# Patient Record
Sex: Female | Born: 1980 | Race: White | Hispanic: No | Marital: Married | Smoking: Never smoker
Health system: Southern US, Community
[De-identification: ages and names within clinical notes are randomized; demographics above are authoritative.]

## PROBLEM LIST (undated history)

## (undated) DIAGNOSIS — I890 Lymphedema, not elsewhere classified: Secondary | ICD-10-CM

## (undated) DIAGNOSIS — J45909 Unspecified asthma, uncomplicated: Secondary | ICD-10-CM

## (undated) DIAGNOSIS — Z9109 Other allergy status, other than to drugs and biological substances: Secondary | ICD-10-CM

## (undated) DIAGNOSIS — G4730 Sleep apnea, unspecified: Secondary | ICD-10-CM

## (undated) DIAGNOSIS — F419 Anxiety disorder, unspecified: Secondary | ICD-10-CM

## (undated) DIAGNOSIS — I10 Essential (primary) hypertension: Secondary | ICD-10-CM

## (undated) DIAGNOSIS — E282 Polycystic ovarian syndrome: Secondary | ICD-10-CM

## (undated) DIAGNOSIS — R7303 Prediabetes: Secondary | ICD-10-CM

## (undated) DIAGNOSIS — E7800 Pure hypercholesterolemia, unspecified: Secondary | ICD-10-CM

## (undated) HISTORY — PX: TYMPANOPLASTY: SHX33

## (undated) HISTORY — DX: Anxiety disorder, unspecified: F41.9

## (undated) HISTORY — DX: Other allergy status, other than to drugs and biological substances: Z91.09

## (undated) HISTORY — DX: Pure hypercholesterolemia, unspecified: E78.00

## (undated) HISTORY — DX: Lymphedema, not elsewhere classified: I89.0

## (undated) HISTORY — DX: Unspecified asthma, uncomplicated: J45.909

---
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
## 2005-09-18 ENCOUNTER — Other Ambulatory Visit: Admission: RE | Admit: 2005-09-18 | Discharge: 2005-09-18 | Payer: Self-pay | Admitting: Obstetrics & Gynecology

## 2006-10-11 ENCOUNTER — Other Ambulatory Visit: Admission: RE | Admit: 2006-10-11 | Discharge: 2006-10-11 | Payer: Self-pay | Admitting: Obstetrics and Gynecology

## 2006-11-15 ENCOUNTER — Ambulatory Visit (HOSPITAL_COMMUNITY): Admission: RE | Admit: 2006-11-15 | Discharge: 2006-11-15 | Payer: Self-pay | Admitting: Obstetrics & Gynecology

## 2006-11-15 IMAGING — RF DG HYSTEROGRAM
5 series · 5 of 5 positions shown · non-contrast
Comparison: none

CLINICAL DATA: Primary infertility.
 HYSTEROSALPINGOGRAM:

[Series 1: run · 1 of 1 slices shown (1 of 5)]
[im 1/1]
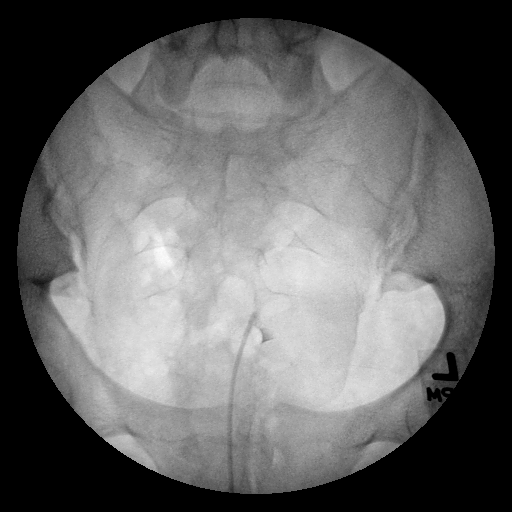

[Series 2: run · 1 of 1 slices shown (2 of 5)]
[im 1/1]
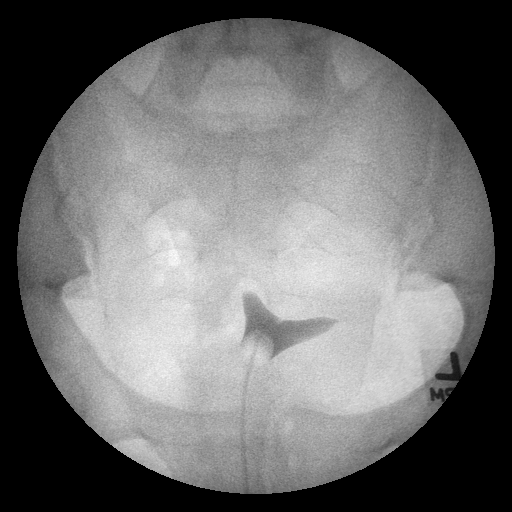

[Series 3: run · 1 of 1 slices shown (3 of 5)]
[im 1/1]
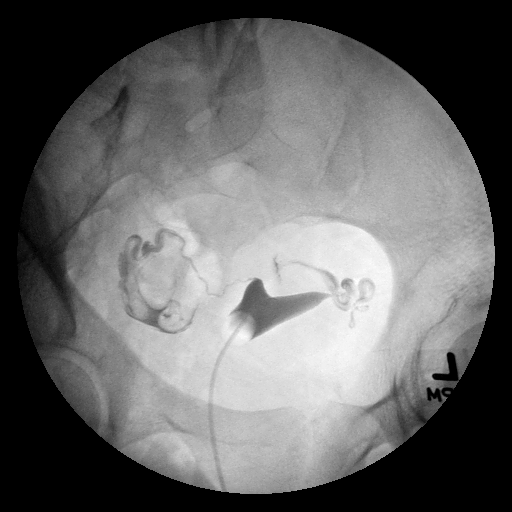

[Series 4: run · 1 of 1 slices shown (4 of 5)]
[im 1/1]
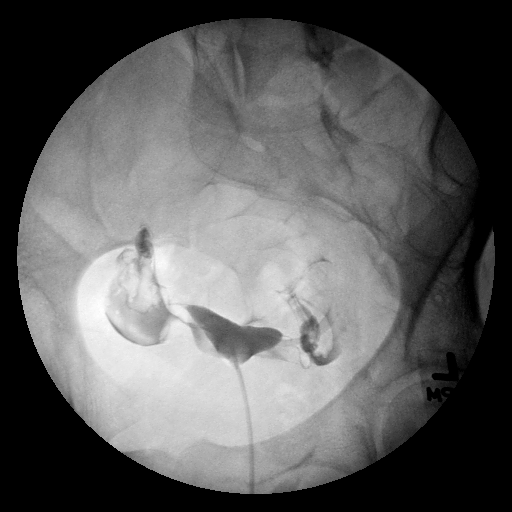

[Series 5: run · 1 of 1 slices shown (5 of 5)]
[im 1/1]
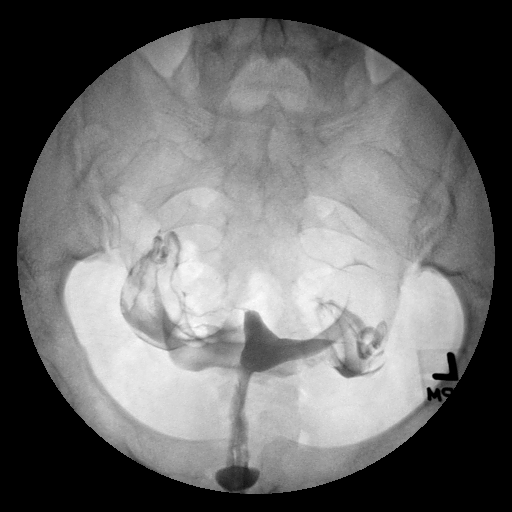

[5 of 5 positions shown; findings below may reference images not displayed]

FINDINGS: Following cleansing of the cervix and vagina with Betadine solution, a hysterosalpingogram was performed using a 5-French hysterosalpingogram catheter and Omnipaque 300 contrast.  The patient tolerated the examination without difficulty.
 The endometrial cavity of the uterus is normal in contour and appearance.
 Opacification of both fallopian tubes is seen.  Intraperitoneal spill of contrast is seen from both fallopian tubes.
IMPRESSION: Both fallopian tubes are patent.  Normal appearance of the endometrial cavity.

## 2008-02-26 ENCOUNTER — Inpatient Hospital Stay (HOSPITAL_COMMUNITY): Admission: AD | Admit: 2008-02-26 | Discharge: 2008-02-26 | Payer: Self-pay | Admitting: Obstetrics and Gynecology

## 2008-03-01 ENCOUNTER — Inpatient Hospital Stay (HOSPITAL_COMMUNITY): Admission: AD | Admit: 2008-03-01 | Discharge: 2008-03-01 | Payer: Self-pay | Admitting: Obstetrics and Gynecology

## 2008-03-02 ENCOUNTER — Inpatient Hospital Stay (HOSPITAL_COMMUNITY): Admission: RE | Admit: 2008-03-02 | Discharge: 2008-03-05 | Payer: Self-pay | Admitting: Obstetrics and Gynecology

## 2008-03-06 ENCOUNTER — Encounter: Admission: RE | Admit: 2008-03-06 | Discharge: 2008-03-15 | Payer: Self-pay | Admitting: Obstetrics and Gynecology

## 2009-01-15 DIAGNOSIS — D802 Selective deficiency of immunoglobulin A [IgA]: Secondary | ICD-10-CM

## 2009-01-15 HISTORY — DX: Selective deficiency of immunoglobulin a (iga): D80.2

## 2010-02-05 ENCOUNTER — Encounter: Payer: Self-pay | Admitting: Obstetrics & Gynecology

## 2010-05-02 LAB — COMPREHENSIVE METABOLIC PANEL
ALT: 18 U/L (ref 0–35)
Albumin: 2.6 g/dL — ABNORMAL LOW (ref 3.5–5.2)
Albumin: 2 g/dL — ABNORMAL LOW (ref 3.5–5.2)
BUN: 8 mg/dL (ref 6–23)
CO2: 23 mEq/L (ref 19–32)
Calcium: 9.2 mg/dL (ref 8.4–10.5)
Calcium: 8.3 mg/dL — ABNORMAL LOW (ref 8.4–10.5)
Chloride: 103 mEq/L (ref 96–112)
Chloride: 102 mEq/L (ref 96–112)
Creatinine, Ser: 0.84 mg/dL (ref 0.4–1.2)
GFR calc Af Amer: >60 mL/min (ref >60)
GFR calc Af Amer: >60 mL/min (ref >60)
GFR calc non Af Amer: >60 mL/min (ref >60)
GFR calc non Af Amer: >60 mL/min (ref >60)
Glucose, Bld: 72 mg/dL (ref 70–99)
Glucose, Bld: 85 mg/dL (ref 70–99)
Potassium: 4.4 mEq/L (ref 3.5–5.1)
Sodium: 134 mEq/L — ABNORMAL LOW (ref 135–145)
Sodium: 135 mEq/L (ref 135–145)
Sodium: 135 mEq/L (ref 135–145)
Total Bilirubin: 0.4 mg/dL (ref 0.3–1.2)
Total Bilirubin: 0.5 mg/dL (ref 0.3–1.2)
Total Protein: 6.2 g/dL (ref 6.0–8.3)

## 2010-05-02 LAB — RPR: RPR Ser Ql: NONREACTIVE

## 2010-05-02 LAB — CBC
HCT: 39.2 % (ref 36.0–46.0)
HCT: 30.3 % — ABNORMAL LOW (ref 36.0–46.0)
HCT: 30.9 % — ABNORMAL LOW (ref 36.0–46.0)
HCT: 37.9 % (ref 36.0–46.0)
Hemoglobin: 12.6 g/dL (ref 12.0–15.0)
Hemoglobin: 10.1 g/dL — ABNORMAL LOW (ref 12.0–15.0)
Hemoglobin: 12.4 g/dL (ref 12.0–15.0)
MCHC: 32.7 g/dL (ref 30.0–36.0)
MCHC: 32.8 g/dL (ref 30.0–36.0)
MCHC: 32.7 g/dL (ref 30.0–36.0)
MCHC: 33.1 g/dL (ref 30.0–36.0)
MCV: 89.4 fL (ref 78.0–100.0)
MCV: 89 fL (ref 78.0–100.0)
MCV: 89.6 fL (ref 78.0–100.0)
Platelets: 271 10*3/uL (ref 150–400)
Platelets: 205 10*3/uL (ref 150–400)
Platelets: 249 10*3/uL (ref 150–400)
RBC: 4.39 MIL/uL (ref 3.87–5.11)
RBC: 4.26 MIL/uL (ref 3.87–5.11)
RDW: 14.7 % (ref 11.5–15.5)
WBC: 12.7 10*3/uL — ABNORMAL HIGH (ref 4.0–10.5)
WBC: 13.8 10*3/uL — ABNORMAL HIGH (ref 4.0–10.5)
WBC: 16.5 10*3/uL — ABNORMAL HIGH (ref 4.0–10.5)

## 2010-05-02 LAB — LACTATE DEHYDROGENASE: LDH: 143 U/L (ref 94–250)

## 2010-05-02 LAB — URIC ACID
Uric Acid, Serum: 5.7 mg/dL (ref 2.4–7.0)
Uric Acid, Serum: 6.2 mg/dL (ref 2.4–7.0)
Uric Acid, Serum: 6.9 mg/dL (ref 2.4–7.0)

## 2010-05-30 NOTE — Op Note (Signed)
NAME:  Lori Pitts, Lori Pitts NO.:  [id number]   MEDICAL RECORD NO.:  [id number]          PATIENT TYPE:  INP   LOCATION:  9106                          FACILITY:  [facility]   PHYSICIAN:  Juluis Mire, M.D.   DATE OF BIRTH:  07/19/1980   DATE OF PROCEDURE:  DATE OF DISCHARGE:                               OPERATIVE REPORT   PREOPERATIVE DIAGNOSES:  1. Pregnancy at 39 weeks.  2. Pregnancy-induced hypertension.  3. Induction of labor.  4. Failure to progress.   POSTOPERATIVE DIAGNOSES:  1. Pregnancy at 39 weeks.  2. Pregnancy-induced hypertension.  3. Induction of labor.  4. Failure to progress.   PROCEDURE:  Low-transverse cesarean section.   SURGEON:  Juluis Mire, MD   ANESTHESIA:  Epidural.   ESTIMATED BLOOD LOSS:  500-600 mL.   PACKS AND DRAINS:  None.   INTRAOPERATIVE BLOOD PLACED:  None.   COMPLICATIONS:  None.   INDICATIONS:  A 30 year old primigravida female presents at 39 weeks for  induction of labor, pregnancy complicated by elevated blood pressure.  The patient progressed with the aid of Pitocin to 6 cm, was arrested  there for approximately 4 hours despite adequate uterine activity as  documented by the intrauterine pressure catheter, and decision to  proceed with cesarean section for failure to progress.  The risks were  explained including the risk of infection, the risk of hemorrhage that  could require transfusion, the risk of AIDS or hepatitis.  Excessive  bleeding could require hysterectomy.  Risk of injury to adjacent organs  including bladder, bowel, or ureters that could require further  exploratory surgery.  Risk of deep venous thrombosis and pulmonary  embolus.  The patient expressed understanding of indications and risks.   PROCEDURES IN DETAIL:  The patient was taken to OR, placed supine in  position with left lateral tilt.  After a satisfactory level of epidural  anesthesia was obtained, the abdomen was prepped out with Betadine  and  draped in sterile field.  A low-transverse skin incision made with knife  and carried through the subcutaneous tissue.  Fascia was entered sharp  and incision fashioned laterally.  Fascia taken off the muscle  superiorly and inferiorly.  Rectus muscles were separated in the  midline.  The perineum was entered sharp and the incision of perineum  extended both superiorly and inferiorly.  Low-transverse bladder flap  developed.  Low-transverse uterine incision made with knife.  The infant  was delivered with elevation in head and fundal pressure.  It is of note  that the amniotic fluid was clear at this point in time, although  initial on AROM, there was a question of meconium.  There was a nuchal  and shoulder cord that was easily reduced.  The infant was a viable  female.  Weight is pending.  The Apgars were 5/7.  Umbilical cord pH was  7.25.  Placenta was delivered manually.  Uterus was then exteriorized  for closure.  Uterus, tubes, and ovaries were unremarkable.  Uterus  closed with running-locking suture of 0 chromic using a two-layered  closure  technique.  Good hemostasis was noted.  Uterus was returned to  the abdominal cavity.  We irrigated the pelvis, had good hemostasis.  Urine output was bloody as she presented to the OR.  It was clearing in  the tube at the end of cesarean section.  The evaluation of bladder  revealed no lacerations.  Muscles and peritoneum closed with a running  suture of 3-0 Vicryl.  Fascia closed with a running suture of 0 PDS.  Skin was closed with staples and Steri-Strips.  Sponge, instrument, and  needle count was correct by circulating nurse x2.  Foley catheter was  clear at the time of closure.  The patient tolerated the procedure well  and was returned to recovery room in good condition.      Juluis Mire, M.D.  Electronically Signed     JSM/MEDQ  D:  03/02/2008  T:  03/03/2008  Job:  [id number]

## 2010-05-30 NOTE — Discharge Summary (Signed)
NAMEAVAH, BASHOR                 ACCOUNT NO.:  [id number]   MEDICAL RECORD NO.:  [id number]          PATIENT TYPE:  INP   LOCATION:  9106                          FACILITY:  [facility]   PHYSICIAN:  Dineen Kid. Rana Snare, M.D.    DATE OF BIRTH:  10-17-1980   DATE OF ADMISSION:  03/02/2008  DATE OF DISCHARGE:  03/05/2008                               DISCHARGE SUMMARY   ADMITTING DIAGNOSES:  1. Intrauterine pregnancy at 25 weeks' estimated gestational age.  2. Pregnancy-induced hypertension.  3. Induction of labor.   DISCHARGE DIAGNOSES:  1. Status post low transverse cesarean section secondary to failure to      progress.  2. Viable female infant.   PROCEDURE:  Low transverse cesarean section.   REASON FOR ADMISSION:  Please see written H and P.   HOSPITAL COURSE:  The patient is a 30 year old primigravida that was  ad[facility] to [facility] at 24 weeks' estimated  gestational age for an induction of labor secondary to pregnancy-induced  hypertension.  The patient was started on Pitocin for augmentation of  her labor.  She did progress to 6 cm, at which time she did arrest and  made no further progress.  After 4 hours, it persistent 6 cm without  further change in the cervix.  decision was made to proceed with a  primary low transverse cesarean section.  The patient had an epidural  placed for her comfort, it now was dosed to an adequate surgical level.  The patient was taken to the operating room where low transverse  incision was made with delivery of a viable female infant weighing 7  pounds 0 ounces with Apgars of 5 at 1 minute and 7 and 5 minutes.  The  patient tolerated the procedure well and was taken to the recovery room  in stable condition.  On postoperative day 1, the patient was without  complaint.  Vital signs were stable.  She was afebrile.  Blood pressure  was 112/71 to 137/92.  Abdomen was soft.  Lungs were clear to  auscultation.  Fundus was firm and  nontender.  Abdominal dressing was  noted be clean, dry, and intact.  Laboratory findings showed hemoglobin  of 10.2, platelet count of 205,000, WBC count of 16.5.  Blood type was  noted to be A+.  On postoperative day 2, the patient denied headache,  blurred vision, or right upper quadrant pain.  She had not been given  anything for pain secondary to documented allergy to TYLENOL.  The  patient had previously taken Extra Strength Tylenol during her pregnancy  and therefore was not considered to be allergic.  Vital signs were  stable.  Blood pressure 125/83 to 123/87.  Deep tendon reflexes were 3+  with 1 beat of clonus bilaterally.  Abdomen was soft, no right upper  quadrant tenderness noted.  Fundus was firm and nontender.  Incision was  clean, dry, and intact.  She was ambulating.  PIH labs were drawn, and  the patient was started on some Percocet.  Later that morning, PIH labs  returned which were  within normal limits with exception of uric acid of  6.9.  On postoperative day 3, the patient was without complaint.  Continued to deny headache or blurred vision.  Vital signs were stable.  She was afebrile.  Abdomen was soft.  Fundus firm and nontender.  Incision was clean, dry, and intact.  Discharge instructions were  reviewed, and the patient was later discharged home.   CONDITION ON DISCHARGE:  Stable.   DIET:  Regular as tolerated.   ACTIVITY:  No heavy lifting, no driving x2 weeks, no vaginal entry.   FOLLOWUP:  The patient is to follow up in the office in 1-2 weeks for an  incision check.  She is to call for temperature greater than 100  degrees, persistent nausea, vomiting, heavy vaginal bleeding, and/or  redness or drainage from the incisional site.  The patient was also  instructed to call for headache, blurred vision, or right upper quadrant  pain.   DISCHARGE MEDICATIONS:  1. Tylox #30 one p.o. every 4-6 hours p.r.n.  2. Motrin 600 mg every 6 hours.  3. Continue the  Wellbutrin and Prozac as previously prescribed.  4. Prenatal vitamins 1 p.o. daily.      Julio Sicks, N.P.      Dineen Kid Rana Snare, M.D.  Electronically Signed    CC/MEDQ  D:  03/05/2008  T:  03/05/2008  Job:  [id number]

## 2012-05-26 DIAGNOSIS — D802 Selective deficiency of immunoglobulin A [IgA]: Secondary | ICD-10-CM | POA: Insufficient documentation

## 2012-06-21 DIAGNOSIS — K589 Irritable bowel syndrome without diarrhea: Secondary | ICD-10-CM | POA: Insufficient documentation

## 2015-04-17 ENCOUNTER — Emergency Department (HOSPITAL_COMMUNITY)
Admission: EM | Admit: 2015-04-17 | Discharge: 2015-04-17 | Disposition: A | Discharge status: Home / Self Care | Payer: Managed Care, Other (non HMO) | Attending: Emergency Medicine | Admitting: Emergency Medicine

## 2015-04-17 ENCOUNTER — Encounter (HOSPITAL_COMMUNITY): Payer: Self-pay

## 2015-04-17 DIAGNOSIS — R0602 Shortness of breath: Secondary | ICD-10-CM | POA: Insufficient documentation

## 2015-04-17 DIAGNOSIS — R55 Syncope and collapse: Secondary | ICD-10-CM | POA: Diagnosis present

## 2015-04-17 DIAGNOSIS — Z7952 Long term (current) use of systemic steroids: Secondary | ICD-10-CM | POA: Diagnosis not present

## 2015-04-17 DIAGNOSIS — Z79899 Other long term (current) drug therapy: Secondary | ICD-10-CM | POA: Diagnosis not present

## 2015-04-17 DIAGNOSIS — R42 Dizziness and giddiness: Secondary | ICD-10-CM | POA: Diagnosis not present

## 2015-04-17 DIAGNOSIS — R531 Weakness: Secondary | ICD-10-CM | POA: Insufficient documentation

## 2015-04-17 DIAGNOSIS — Z3202 Encounter for pregnancy test, result negative: Secondary | ICD-10-CM | POA: Insufficient documentation

## 2015-04-17 LAB — COMPREHENSIVE METABOLIC PANEL
ALK PHOS: 66 U/L (ref 38–126)
ALT: 18 U/L (ref 14–54)
AST: 21 U/L (ref 15–41)
Albumin: 3.4 g/dL — ABNORMAL LOW (ref 3.5–5.0)
Anion gap: 9 (ref 5–15)
BUN: 8 mg/dL (ref 6–20)
CHLORIDE: 107 mmol/L (ref 101–111)
CO2: 24 mmol/L (ref 22–32)
Calcium: 8.8 mg/dL — ABNORMAL LOW (ref 8.9–10.3)
Creatinine, Ser: 0.64 mg/dL (ref 0.44–1.00)
GFR calc non Af Amer: >60 mL/min (ref >60)
Glucose, Bld: 108 mg/dL — ABNORMAL HIGH (ref 65–99)
POTASSIUM: 3.7 mmol/L (ref 3.5–5.1)
SODIUM: 140 mmol/L (ref 135–145)
Total Bilirubin: 0.5 mg/dL (ref 0.3–1.2)
Total Protein: 6.7 g/dL (ref 6.5–8.1)

## 2015-04-17 LAB — CBC WITH DIFFERENTIAL/PLATELET
Basophils Absolute: 0 10*3/uL (ref 0.0–0.1)
Basophils Relative: 0 %
EOS ABS: 0.2 10*3/uL (ref 0.0–0.7)
EOS PCT: 2 %
HCT: 41.3 % (ref 36.0–46.0)
Hemoglobin: 13.4 g/dL (ref 12.0–15.0)
LYMPHS ABS: 2.3 10*3/uL (ref 0.7–4.0)
Lymphocytes Relative: 23 %
MCH: 29.2 pg (ref 26.0–34.0)
MCHC: 32.4 g/dL (ref 30.0–36.0)
MCV: 90 fL (ref 78.0–100.0)
MONO ABS: 0.5 10*3/uL (ref 0.1–1.0)
MONOS PCT: 5 %
Neutro Abs: 7 10*3/uL (ref 1.7–7.7)
Neutrophils Relative %: 70 %
PLATELETS: 361 10*3/uL (ref 150–400)
RBC: 4.59 MIL/uL (ref 3.87–5.11)
RDW: 12.6 % (ref 11.5–15.5)
WBC: 10.1 10*3/uL (ref 4.0–10.5)

## 2015-04-17 LAB — URINALYSIS, ROUTINE W REFLEX MICROSCOPIC
Bilirubin Urine: NEGATIVE
Glucose, UA: NEGATIVE mg/dL
Hgb urine dipstick: NEGATIVE
Ketones, ur: NEGATIVE mg/dL
Leukocytes, UA: NEGATIVE
NITRITE: NEGATIVE
PH: 8 (ref 5.0–8.0)
Protein, ur: NEGATIVE mg/dL
SPECIFIC GRAVITY, URINE: 1.012 (ref 1.005–1.030)

## 2015-04-17 LAB — CBG MONITORING, ED: Glucose-Capillary: 94 mg/dL (ref 65–99)

## 2015-04-17 LAB — PREGNANCY, URINE: Preg Test, Ur: NEGATIVE

## 2015-04-17 MED ORDER — LORAZEPAM 1 MG PO TABS: 0.5000 mg | ORAL_TABLET | Freq: Three times a day (TID) | ORAL | Status: DC | PRN

## 2015-04-17 NOTE — ED Provider Notes (Signed)
CSN: [id number]     Arrival date & time 04/17/15  1934 History   First MD Initiated Contact with Patient 04/17/15 1959     Chief Complaint  Patient presents with  . Near Syncope     (Consider location/radiation/quality/duration/timing/severity/associated sxs/prior Treatment) HPI Comments: 35 y/o female with a hx of PCOS presents to the ED for evaluation of an episode of near syncope. Patient reports that she was at her son's baseball game when she began to feel very anxious. She reports that she was breathing fairly quickly and began to feel lightheaded. She was told to sit down by a friend which mildly improved her symptoms. She now complains of feeling "really tired"; her symptoms resolved spontaneously. No medications taken prior to arrival. She reports a history of similar symptoms one week ago. She states that she was standing in the shower when she began to feel lightheaded and sat down to try and relieve her lightheadedness. Husband reports that the patient lost consciousness for a period. He noticed the patient was "gurgling" at her mouth. Reported tongue biting, seizure activity/tremors, or incontinence. Symptoms lasted for 30-45 seconds with no postictal phase after regaining consciousness. Patient reports remembering that her hands were "cramped up" for a few minutes afterwards. She saw her primary care doctor this past Tuesday who completed blood work as well as Barista and an EKG. She has yet to obtain the results of her blood work. She states that her doctor was considering sending her for a "brain scan". She denies history of anxiety.  Patient is a 35 y.o. female presenting with near-syncope. The history is provided by the patient. No language interpreter was used.  Near Syncope Associated symptoms include weakness (generalized). Pertinent negatives include no fever, nausea or vomiting.    History reviewed. No pertinent past medical history. History reviewed. No pertinent past  surgical history. History reviewed. No pertinent family history. Social History  Substance Use Topics  . Smoking status: Never Smoker   . Smokeless tobacco: None  . Alcohol Use: No   OB History    No data available      Review of Systems  Constitutional: Negative for fever.  Respiratory: Positive for shortness of breath.   Cardiovascular: Positive for near-syncope.  Gastrointestinal: Negative for nausea and vomiting.  Neurological: Positive for weakness (generalized) and light-headedness.  Ten systems reviewed and are negative for acute change, except as noted in the HPI.    Allergies  Cherry; Benadryl; Bupropion; Duloxetine; Aspirin; Robitussin cold cough+ chest ; Tussin; and Tylenol  Home Medications   Prior to Admission medications   Medication Sig Start Date End Date Taking? Authorizing Provider  Armodafinil 250 MG tablet Take 250 mg by mouth daily. 03/03/15  Yes Historical Provider, MD  cetirizine (ZYRTEC) 10 MG tablet Take 10 mg by mouth at bedtime.   Yes Historical Provider, MD  Cholecalciferol 1000 UNT/0.03ML LIQD Take 6,000 Units by mouth daily.   Yes Historical Provider, MD  montelukast (SINGULAIR) 10 MG tablet Take 10 mg by mouth at bedtime.   Yes Historical Provider, MD  PROAIR RESPICLICK 108 [phone] Base) MCG/ACT AEPB Take 2 puffs by mouth daily as needed (20-30 minutes before physical exertion).  01/27/15  Yes Historical Provider, MD  triamcinolone (NASACORT) 55 MCG/ACT AERO nasal inhaler Place 2 sprays into the nose daily.   Yes Historical Provider, MD  LORazepam (ATIVAN) 1 MG tablet Take 0.5 tablets (0.5 mg total) by mouth 3 (three) times daily as needed for anxiety. 04/17/15  Antony MaduraKelly Nathanyl Andujo, PA-C   BP 119/63 mmHg  Pulse 70  Resp 20  SpO2 99%   Physical Exam  Constitutional: She is oriented to person, place, and time. She appears well-developed and well-nourished. No distress.  Nontoxic/nonseptic appearing  HENT:  Head: Normocephalic and atraumatic.  Mouth/Throat:  Oropharynx is clear and moist. No oropharyngeal exudate.  Eyes: Conjunctivae and EOM are normal. Pupils are equal, round, and reactive to light. No scleral icterus.  Neck: Normal range of motion.  Cardiovascular: Normal rate, regular rhythm and intact distal pulses.   Pulmonary/Chest: Effort normal and breath sounds normal. No respiratory distress. She has no wheezes. She has no rales.  Respirations even and unlabored. Lungs CTAB.  Musculoskeletal: Normal range of motion.  Neurological: She is alert and oriented to person, place, and time. No cranial nerve deficit. She exhibits normal muscle tone. Coordination normal.  GCS 15. Speech is goal oriented. No cranial nerve deficits appreciated; symmetric eyebrow raise, no facial drooping, tongue midline. Patient has equal grip strength bilaterally with 5/5 strength against resistance in all major muscle groups bilaterally. Sensation to light touch intact. Patient moves extremities without ataxia; normal finger-nose-finger. Patient ambulatory with steady gait.  Skin: Skin is warm and dry. No rash noted. She is not diaphoretic. No erythema. No pallor.  Psychiatric: She has a normal mood and affect. Her behavior is normal.  Nursing note and vitals reviewed.   ED Course  Procedures (including critical care time) Labs Review Labs Reviewed  COMPREHENSIVE METABOLIC PANEL - Abnormal; Notable for the following:    Glucose, Bld 108 (*)    Calcium 8.8 (*)    Albumin 3.4 (*)    All other components within normal limits  CBC WITH DIFFERENTIAL/PLATELET  URINALYSIS, ROUTINE W REFLEX MICROSCOPIC (NOT AT [facility])  PREGNANCY, URINE  CBG MONITORING, ED    Imaging Review No results found.   I have personally reviewed and evaluated these images and lab results as part of my medical decision-making.   EKG Interpretation   Date/Time:  Sunday April 17 2015 20:44:19 EDT Ventricular Rate:  80 PR Interval:  104 QRS Duration: 90 QT Interval:  402 QTC  Calculation: 464 R Axis:   61 Text Interpretation:  Sinus rhythm Short PR interval Baseline wander in  lead(s) V2 no signs of wpw, prolonged qt, brugada, HOCM, or arrythmogenic  RV No old tracing to compare Confirmed by FLOYD MD, DANIEL [phone]([id number]) on  04/17/2015 10:45:56 PM      MDM   Final diagnoses:  Near syncope    35 year old female presents to the emergency department after a near syncopal event characterized by increasing anxiety and hyperventilation followed by lightheadedness. Patient had a similar episode one week ago after which time she was evaluated by her primary care doctor. Her laboratory workup today is noncontributory. No reported seizure-like activity or postictal phase. No electrolyte abnormalities. EKG with a slightly shortened PR interval, but no ischemic changes.   Patient reports feeling tired, but denies any recurrence of her symptoms. I have discussed her workup and EKG findings with the patient at bedside who verbalizes understanding. I have recommended that she see her primary care doctor for further evaluation of her symptoms. I have also advised the patient to discuss having a Holter monitor placed by her PCP for further work up. No indication for further emergent testing at this time. Patient stable for discharge with no unaddressed concerns; discharged in good condition with stable vital signs.   Filed Vitals:   04/17/15 2000  04/17/15 2100 04/17/15 2130 04/17/15 2200  BP: 119/73 135/76 136/84 119/63  Pulse: 81 78 77 70  Resp: SpO2: 97% 97% 99% 99%     Antony Madura, PA-C 04/17/15 2338  Melene Plan, DO 04/19/15 0001

## 2015-04-17 NOTE — ED Notes (Signed)
Per EMS: Pt complaining of seizure like activity. No post-ictal phase. Family states pt "might have had a panic attack." Pt is being evaluated by a neurologist. A/o x 4. Pt complaining of being "real tired."

## 2015-04-17 NOTE — Discharge Instructions (Signed)
We recommend ativan to treat potential underlying anxiety. Take only as needed. We also recommend you discuss a Holter monitor with your primary care doctor to further monitor your heart rhythm around times when symptoms occur. Be sure to drink at least 8 - 8 ounce glasses or water per day. Try and get a regular 7-8 hours of sleep per night. Follow up with your primary care doctor.  Panic Attacks Panic attacks are sudden, short-livedsurges of severe anxiety, fear, or discomfort. They may occur for no reason when you are relaxed, when you are anxious, or when you are sleeping. Panic attacks may occur for a number of reasons:   Healthy people occasionally have panic attacks in extreme, life-threatening situations, such as war or natural disasters. Normal anxiety is a protective mechanism of the body that helps [place] react to danger (fight or flight response).  Panic attacks are often seen with anxiety disorders, such as panic disorder, social anxiety disorder, generalized anxiety disorder, and phobias. Anxiety disorders cause excessive or uncontrollable anxiety. They may interfere with your relationships or other life activities.  Panic attacks are sometimes seen with other mental illnesses, such as depression and posttraumatic stress disorder.  Certain medical conditions, prescription medicines, and drugs of abuse can cause panic attacks. SYMPTOMS  Panic attacks start suddenly, peak within 20 minutes, and are accompanied by four or more of the following symptoms:  Pounding heart or fast heart rate (palpitations).  Sweating.  Trembling or shaking.  Shortness of breath or feeling smothered.  Feeling choked.  Chest pain or discomfort.  Nausea or strange feeling in your stomach.  Dizziness, light-headedness, or feeling like you will faint.  Chills or hot flushes.  Numbness or tingling in your lips or hands and feet.  Feeling that things are not real or feeling that you are not  yourself.  Fear of losing control or going crazy.  Fear of dying. Some of these symptoms can mimic serious medical conditions. For example, you may think you are having a heart attack. Although panic attacks can be very scary, they are not life threatening. DIAGNOSIS  Panic attacks are diagnosed through an assessment by your health care provider. Your health care provider will ask questions about your symptoms, such as where and when they occurred. Your health care provider will also ask about your medical history and use of alcohol and drugs, including prescription medicines. Your health care provider may order blood tests or other studies to rule out a serious medical condition. Your health care provider may refer you to a mental health professional for further evaluation. TREATMENT   Most healthy people who have one or two panic attacks in an extreme, life-threatening situation will not require treatment.  The treatment for panic attacks associated with anxiety disorders or other mental illness typically involves counseling with a mental health professional, medicine, or a combination of both. Your health care provider will help determine what treatment is best for you.  Panic attacks due to physical illness usually go away with treatment of the illness. If prescription medicine is causing panic attacks, talk with your health care provider about stopping the medicine, decreasing the dose, or substituting another medicine.  Panic attacks due to alcohol or drug abuse go away with abstinence. Some adults need professional help in order to stop drinking or using drugs. HOME CARE INSTRUCTIONS   Take all medicines as directed by your health care provider.   Schedule and attend follow-up visits as directed by your health care provider.  It is important to keep all your appointments. SEEK MEDICAL CARE IF:  You are not able to take your medicines as prescribed.  Your symptoms do not improve or  get worse. SEEK IMMEDIATE MEDICAL CARE IF:   You experience panic attack symptoms that are different than your usual symptoms.  You have serious thoughts about hurting yourself or others.  You are taking medicine for panic attacks and have a serious side effect. MAKE SURE YOU:  Understand these instructions.  Will watch your condition.  Will get help right away if you are not doing well or get worse.   This information is not intended to replace advice given to you by your health care provider. Make sure you discuss any questions you have with your health care provider.   Document Released: 01/01/2005 Document Revised: 01/06/2013 Document Reviewed: 08/15/2012 Elsevier Interactive Patient Education 2016 [employer]   Near-Syncope Near-syncope (commonly known as near fainting) is sudden weakness, dizziness, or feeling like you might pass out. This can happen when getting up or while standing for a long time. It is caused by a sudden decrease in blood flow to the brain, which can occur for various reasons. Most of the reasons are not serious.  HOME CARE Watch your condition for any changes.  Have someone stay with you until you feel stable.  If you feel like you are going to pass out:  Lie down right away.  Prop your feet up if you can.  Breathe deeply and steadily.  Move only when the feeling has gone away. Most of the time, this feeling lasts only a few minutes. You may feel tired for several hours.  Drink enough fluids to keep your pee (urine) clear or pale yellow.  If you are taking blood pressure or heart medicine, stand up slowly.  Follow up with your doctor as told. GET HELP RIGHT AWAY IF:   You have a severe headache.  You have unusual pain in the chest, belly (abdomen), or back.  You have bleeding from the mouth or butt (rectum), or you have black or tarry poop (stool).  You feel your heart beat differently than normal, or you have a very fast  pulse.  You pass out, or you twitch and shake when you pass out.  You pass out when sitting or lying down.  You feel confused.  You have trouble walking.  You are weak.  You have vision problems. MAKE SURE YOU:   Understand these instructions.  Will watch your condition.  Will get help right away if you are not doing well or get worse.   This information is not intended to replace advice given to you by your health care provider. Make sure you discuss any questions you have with your health care provider.   Document Released: 06/20/2007 Document Revised: 01/22/2014 Document Reviewed: 06/06/2012 Elsevier Interactive Patient Education [employer]

## 2015-04-21 DIAGNOSIS — R768 Other specified abnormal immunological findings in serum: Secondary | ICD-10-CM | POA: Insufficient documentation

## 2015-05-30 ENCOUNTER — Ambulatory Visit (INDEPENDENT_AMBULATORY_CARE_PROVIDER_SITE_OTHER): Payer: Managed Care, Other (non HMO) | Admitting: Neurology

## 2015-05-30 ENCOUNTER — Encounter: Payer: Self-pay | Admitting: Neurology

## 2015-05-30 ENCOUNTER — Telehealth: Payer: Self-pay | Admitting: Neurology

## 2015-05-30 VITALS — BP 125/78 | HR 90 | Resp 18 | Ht 66.0 in | Wt 249.0 lb

## 2015-05-30 DIAGNOSIS — E282 Polycystic ovarian syndrome: Secondary | ICD-10-CM | POA: Diagnosis not present

## 2015-05-30 DIAGNOSIS — F411 Generalized anxiety disorder: Secondary | ICD-10-CM

## 2015-05-30 DIAGNOSIS — F32A Depression, unspecified: Secondary | ICD-10-CM

## 2015-05-30 DIAGNOSIS — R404 Transient alteration of awareness: Secondary | ICD-10-CM

## 2015-05-30 DIAGNOSIS — F429 Obsessive-compulsive disorder, unspecified: Secondary | ICD-10-CM | POA: Diagnosis not present

## 2015-05-30 DIAGNOSIS — D802 Selective deficiency of immunoglobulin A [IgA]: Secondary | ICD-10-CM | POA: Diagnosis not present

## 2015-05-30 DIAGNOSIS — F329 Major depressive disorder, single episode, unspecified: Secondary | ICD-10-CM
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
## 2015-05-30 NOTE — Patient Instructions (Signed)
Remember to drink plenty of fluid, eat healthy meals and do not skip any meals. Try to eat protein with a every meal and eat a healthy snack such as fruit or nuts in between meals. Try to keep a regular sleep-wake schedule and try to exercise daily, particularly in the form of walking, 20-30 minutes a day, if you can.   As far as your medications are concerned, I would like to suggest: continue current medications  As far as diagnostic testing: EEG then 3-day eeg  I would like to see you back after workup complete, sooner if we need to. Please call us with any interim questions, concerns, problems, updates or refill requests.   Our phone number is [phone]. We also have an after hours call service for urgent matters and there is a physician on-call for urgent questions. For any emergencies you know to call 911 or go to the nearest emergency room

## 2015-05-30 NOTE — Telephone Encounter (Signed)
Patient's husband is calling stating they saw Dr. Lucia GaskinsAhern this morning and they are inquiring about whether or not the patient has a driving restriction.  Please call.

## 2015-05-30 NOTE — Telephone Encounter (Signed)
Yes, she cannot drive for 6 months per [place] law until episode free. thanks

## 2015-05-30 NOTE — Telephone Encounter (Signed)
Please advise 

## 2015-05-30 NOTE — Telephone Encounter (Signed)
I spoke to patient and gave her orders below. She voiced understanding and concern about how to get to work. I advised her to talk with her HR and let us know if they need us to fill out any ppw.

## 2015-05-31 ENCOUNTER — Encounter: Payer: Self-pay | Admitting: Neurology

## 2015-05-31 DIAGNOSIS — E282 Polycystic ovarian syndrome: Secondary | ICD-10-CM | POA: Insufficient documentation

## 2015-05-31 DIAGNOSIS — D802 Selective deficiency of immunoglobulin A [IgA]: Secondary | ICD-10-CM | POA: Insufficient documentation

## 2015-05-31 DIAGNOSIS — F32A Depression, unspecified: Secondary | ICD-10-CM | POA: Insufficient documentation

## 2015-05-31 DIAGNOSIS — R404 Transient alteration of awareness: Secondary | ICD-10-CM | POA: Insufficient documentation

## 2015-05-31 DIAGNOSIS — F429 Obsessive-compulsive disorder, unspecified: Secondary | ICD-10-CM | POA: Insufficient documentation

## 2015-05-31 DIAGNOSIS — F329 Major depressive disorder, single episode, unspecified: Secondary | ICD-10-CM | POA: Insufficient documentation

## 2015-05-31 DIAGNOSIS — F411 Generalized anxiety disorder: Secondary | ICD-10-CM | POA: Insufficient documentation

## 2015-06-08 ENCOUNTER — Telehealth: Payer: Self-pay | Admitting: Neurology

## 2015-06-08 NOTE — Telephone Encounter (Signed)
Pt called inquiring if Dr Lucia GaskinsAhern had a chance to look at the MRI CD she brought to [facility]. Please call

## 2015-06-09 NOTE — Telephone Encounter (Signed)
Yes, I left her a message the week she was here. I reviewed the white matter changes with our neuroradiologist and our MS specialist. These are nothing to be concerned about, thanks.

## 2015-06-09 NOTE — Telephone Encounter (Signed)
Called pt back. I relayed Dr Lucia GaskinsAhern message below. She verbalized understanding and stated her phone has been acting up. Advised her to call if she has further questions or concerns. She wanted earlier appt for EEG if possible before 06/22/15. I checked and no openings on [place] schedule before that. If something opens, we will call.

## 2015-06-22 ENCOUNTER — Ambulatory Visit (INDEPENDENT_AMBULATORY_CARE_PROVIDER_SITE_OTHER): Payer: Managed Care, Other (non HMO) | Admitting: Neurology

## 2015-06-22 DIAGNOSIS — R404 Transient alteration of awareness: Secondary | ICD-10-CM

## 2015-06-22 DIAGNOSIS — R410 Disorientation, unspecified: Secondary | ICD-10-CM

## 2015-06-22 NOTE — Procedures (Signed)
    History:  Lori Pitts is a 35 year old patient with a history of episodes of altered awareness. The patient was not responding to her husband, this occurred for about 20-40 seconds, the patient was standing and did not fall. She recalls feeling her arms being numb.  This is a routine EEG. No skull defects are noted. Medications include armodafinil, Zyrtec, Ativan, vitamin D supplementation, Singulair, pro-air, and Nasacort.   EEG classification: Normal awake and drowsy  Description of the recording: The background rhythms of this recording consists of a fairly well modulated medium amplitude alpha rhythm of 9 Hz that is reactive to eye opening and closure. As the record progresses, the patient appears to remain in the waking state throughout the recording. Photic stimulation was not performed. Hyperventilation was performed, resulting in a minimal buildup of the background rhythm activities without significant slowing seen. Toward the end of the recording, the patient enters the drowsy state with slight symmetric slowing seen. The patient never enters stage II sleep. At no time during the recording does there appear to be evidence of spike or spike wave discharges or evidence of focal slowing. EKG monitor shows no evidence of cardiac rhythm abnormalities with a heart rate of 78.  Impression: This is a normal EEG recording in the waking and drowsy state. No evidence of ictal or interictal discharges are seen.

## 2015-06-24 ENCOUNTER — Telehealth: Payer: Self-pay | Admitting: Neurology

## 2015-06-24 NOTE — Telephone Encounter (Addendum)
Patient called to request results of 06/22/15 EEG.

## 2015-06-24 NOTE — Telephone Encounter (Signed)
Error

## 2015-06-25 NOTE — Telephone Encounter (Signed)
EEG was normal. We discussed a possible longer ambulatory eeg, I'm not sure we need it at this point but I will leave that up to patient. I don;t have a high suspicion that these events are seizures. But I am happy to order an extended EEG. thanks

## 2015-06-27 NOTE — Telephone Encounter (Signed)
I have spoken with Lawson FiscalLori this morning, and per AA, advised that EEG is normal.  She verbalized understanding of same, wonders if 3 day EEG is still needed, and if she can drive/fim

## 2015-06-27 NOTE — Telephone Encounter (Signed)
I tried calling, I left a message. Please call patient and Let patient know I don't think these were seizures and eeg is normal. The first event was in the setting of fever and flu and the second event seemed more like anxiety or a panic attack in the setting of stress. I do not think this impairs her ability to drive unless she has a third event. If she has anymore events, I would probably hook her up for 3 days on eeg and tell her not to drive for 6 months. Ok to drive now by me unless she has another event. Thanks.

## 2015-06-27 NOTE — Telephone Encounter (Signed)
-----   Message from Anson FretAntonia B Ahern, MD sent at 06/22/2015  7:58 PM EDT ----- EEG is normal thanks

## 2015-06-27 NOTE — Telephone Encounter (Signed)
I have spoken with Lawson FiscalLori and per AA, advised that AA does not feel the 2 episodes she has had represent sz. d/o; the first was related to fever/flu and the second may have been due to increased stress/anxiety or panic attack.  She may drive.  If she has a 3rd episode, AA will want to see her back in the office, reconsider 3 day EEG, and at that time have restrictions placed on driving.  Lawson FiscalLori verbalized understanding of same/fim

## 2015-06-27 NOTE — Telephone Encounter (Signed)
-----   Message from Antonia B Ahern, MD sent at 06/22/2015  7:58 PM EDT ----- EEG is normal thanks 

## 2016-05-08 DIAGNOSIS — J0100 Acute maxillary sinusitis, unspecified: Secondary | ICD-10-CM | POA: Insufficient documentation

## 2016-10-25 DIAGNOSIS — Z0000 Encounter for general adult medical examination without abnormal findings: Secondary | ICD-10-CM | POA: Insufficient documentation

## 2016-10-25 DIAGNOSIS — R29810 Facial weakness: Secondary | ICD-10-CM | POA: Insufficient documentation

## 2016-10-25 DIAGNOSIS — Z23 Encounter for immunization: Secondary | ICD-10-CM | POA: Insufficient documentation

## 2017-02-15 DIAGNOSIS — J324 Chronic pansinusitis: Secondary | ICD-10-CM | POA: Insufficient documentation

## 2017-10-05 DIAGNOSIS — Z23 Encounter for immunization: Secondary | ICD-10-CM | POA: Diagnosis not present

## 2017-11-19 DIAGNOSIS — H66009 Acute suppurative otitis media without spontaneous rupture of ear drum, unspecified ear: Secondary | ICD-10-CM | POA: Diagnosis not present

## 2018-01-04 DIAGNOSIS — H66009 Acute suppurative otitis media without spontaneous rupture of ear drum, unspecified ear: Secondary | ICD-10-CM | POA: Diagnosis not present

## 2018-01-28 DIAGNOSIS — L508 Other urticaria: Secondary | ICD-10-CM | POA: Diagnosis not present

## 2018-02-03 DIAGNOSIS — Z01419 Encounter for gynecological examination (general) (routine) without abnormal findings: Secondary | ICD-10-CM | POA: Diagnosis not present

## 2018-02-03 DIAGNOSIS — Z6841 Body Mass Index (BMI) 40.0 and over, adult: Secondary | ICD-10-CM | POA: Diagnosis not present

## 2018-02-03 DIAGNOSIS — Z124 Encounter for screening for malignant neoplasm of cervix: Secondary | ICD-10-CM | POA: Diagnosis not present

## 2018-02-03 DIAGNOSIS — N6320 Unspecified lump in the left breast, unspecified quadrant: Secondary | ICD-10-CM | POA: Diagnosis not present

## 2018-02-05 DIAGNOSIS — L239 Allergic contact dermatitis, unspecified cause: Secondary | ICD-10-CM | POA: Diagnosis not present

## 2018-02-19 DIAGNOSIS — J4520 Mild intermittent asthma, uncomplicated: Secondary | ICD-10-CM | POA: Diagnosis not present

## 2018-02-19 DIAGNOSIS — J3089 Other allergic rhinitis: Secondary | ICD-10-CM | POA: Diagnosis not present

## 2018-02-19 DIAGNOSIS — L299 Pruritus, unspecified: Secondary | ICD-10-CM | POA: Diagnosis not present

## 2018-02-20 DIAGNOSIS — N6320 Unspecified lump in the left breast, unspecified quadrant: Secondary | ICD-10-CM | POA: Diagnosis not present

## 2019-08-26 DIAGNOSIS — G4733 Obstructive sleep apnea (adult) (pediatric): Secondary | ICD-10-CM | POA: Insufficient documentation

## 2019-08-26 DIAGNOSIS — R41840 Attention and concentration deficit: Secondary | ICD-10-CM | POA: Insufficient documentation

## 2019-08-26 DIAGNOSIS — R319 Hematuria, unspecified: Secondary | ICD-10-CM | POA: Insufficient documentation

## 2020-04-01 ENCOUNTER — Other Ambulatory Visit: Payer: Self-pay

## 2020-04-01 DIAGNOSIS — F419 Anxiety disorder, unspecified: Secondary | ICD-10-CM | POA: Insufficient documentation

## 2020-04-01 DIAGNOSIS — J45909 Unspecified asthma, uncomplicated: Secondary | ICD-10-CM | POA: Insufficient documentation

## 2020-04-01 DIAGNOSIS — E7800 Pure hypercholesterolemia, unspecified: Secondary | ICD-10-CM | POA: Insufficient documentation

## 2020-04-01 DIAGNOSIS — Z9109 Other allergy status, other than to drugs and biological substances: Secondary | ICD-10-CM | POA: Insufficient documentation

## 2020-04-01 DIAGNOSIS — I890 Lymphedema, not elsewhere classified: Secondary | ICD-10-CM | POA: Insufficient documentation

## 2020-04-10 NOTE — Progress Notes (Signed)
Cardiology Office Note:    Date:  04/11/2020   ID:  Lori Pitts, DOB 1980/11/07, MRN [id number]  PCP:  Leane Call, PA-C  Cardiologist:  Norman Herrlich, MD   Referring MD: Irena Reichmann, DO  ASSESSMENT:    1. Heart palpitations   2. COVID-19 virus infection    PLAN:    In order of problems listed above:  1. She has had episodes of rapid heart rhythm since her second bout of COVID-19 in January.  The differential diagnosis includes autonomic dysfunction secondary to Covid infection versus intermittent atrial arrhythmia.  She has had no recurrent episodes since the ED visit.  We discussed wearing a monitor and at this time I do not think would help.  I will ask her to do is avoid over-the-counter proarrhythmic drugs and to purchase the alive cor smart phone adapter to record episodes and transmit them to me by my chart if it recurs.  I do not think she requires advanced cardiac imaging or an ischemia evaluation at this time. 2. I encouraged her to consider a single dose of Pfizer vaccine which is effective in this group with previous COVID-19 infection as she is quite vaccine hesitant.  Next appointment 6 months or sooner if she is having breakthrough episodes of rapid heart rhythm   Medication Adjustments/Labs and Tests Ordered: Current medicines are reviewed at length with the patient today.  Concerns regarding medicines are outlined above.  Orders Placed This Encounter  Procedures  . EKG 12-Lead   No orders of the defined types were placed in this encounter.    Chief Complaint  Patient presents with  . Tachycardia    History of Present Illness:    Lori Pitts is a 40 y.o. female who is being seen today for the evaluation of palpitation at the request of Irena Reichmann, DO.  She was seen [facility] health ED 03/24/2020 with palpitation and advised to ambulatory heart rhythm monitor.  She had a chest x-ray performed with low lung volumes otherwise normal vital signs in the  ED with a heart rate 84 bpm blood pressure 146/73. She was seen in the emergency room 04/17/2015 with near syncope at that time her EKG was normal. Lipid panel 09/08/2019 showed significant dyslipidemia with LDL 147 total cholesterol 387 HDL 54 triglycerides 244 and non-HDL cholesterol 175.  She was seen at [facility] ED 03/24/2020 there is a notation that she had severe depression was on her way of therapy and she had left shoulder pain and palpitation CBC was normal hemoglobin 12.6 BMP normal potassium 3.5 creatinine 0.7 her EKG was interpreted as sinus tachycardia 101 bpm otherwise normal he was discharged in the emergency room with new prescription for Xanax.  Her EKG reviewed by me showed sinus tachycardia otherwise normal 101 bpm..  Since her second bout of COVID-19 January of this year she has had episodes where her heart rate is rapid makes her feel apprehensive and she has had heart rates in the 130 to 140 bpm on her smart watch.  The day she presented the emergency room was particularly severe persistent mild time she got to the ED was all but resolved.  At times of tightness in her chest with it but does not have exertional chest pain shortness of breath or edema no history of heart disease congenital rheumatic or heart murmur.  She is not vaccinated for COVID-19 Past Medical History:  Diagnosis Date  . Anxiety   . Asthma   . Environmental allergies   .  High cholesterol   . Lymph edema    l leg     Past Surgical History:  Procedure Laterality Date  . CESAREAN SECTION    . TYMPANOPLASTY     c    Current Medications: Current Meds  Medication Sig  . ALPRAZolam (XANAX) 0.25 MG tablet Take 0.25 mg by mouth every 8 (eight) hours as needed.  . cetirizine (ZYRTEC) 10 MG tablet Take 10 mg by mouth at bedtime.  . montelukast (SINGULAIR) 10 MG tablet Take 10 mg by mouth at bedtime.  Marland Kitchen PROAIR RESPICLICK 108 (90 Base) MCG/ACT AEPB Take 2 puffs by mouth daily as needed (20-30  minutes before physical exertion).   . triamcinolone (NASACORT) 55 MCG/ACT AERO nasal inhaler Place 2 sprays into the nose daily.     Allergies:   Cherry, Benadryl [diphenhydramine hcl (sleep)], Bupropion, Duloxetine, Aspirin, Chlorpheniramine, Dextromethorphan-guaifenesin, Hydrocodone, Pseudoephedrine, Tussin [guaifenesin], and Tylenol [acetaminophen]   Social History   Socioeconomic History  . Marital status: Married    Spouse name: Not on file  . Number of children: 1  . Years of education: college  . Highest education level: Not on file  Occupational History  . Not on file  Tobacco Use  . Smoking status: Never Smoker  . Smokeless tobacco: Never Used  Vaping Use  . Vaping Use: Never used  Substance and Sexual Activity  . Alcohol use: Yes    Alcohol/week: 0.0 standard drinks  . Drug use: No  . Sexual activity: Not on file  Other Topics Concern  . Not on file  Social History Narrative   Rare caffeine use    Social Determinants of Health   Financial Resource Strain: Not on file  Food Insecurity: Not on file  Transportation Needs: Not on file  Physical Activity: Not on file  Stress: Not on file  Social Connections: Not on file     Family History: The patient's family history includes Cancer in her maternal grandfather and maternal grandmother; Diabetes in her father and mother; Melanoma in her father, maternal grandmother, and paternal grandfather. There is no history of Seizures.  ROS:   ROS Please see the history of present illness.     All other systems reviewed and are negative.  EKGs/Labs/Other Studies Reviewed:    The following studies were reviewed today:   EKG:  EKG is  ordered today.  The ekg ordered today is personally reviewed and demonstrates sinus rhythm normal EKG   Physical Exam:    VS:  BP 122/76   Pulse 91   Ht 5\' 6"  (1.676 m)   Wt 293 lb 1.3 oz (132.9 kg)   SpO2 98%   BMI 47.30 kg/m     Wt Readings from Last 3 Encounters:  04/11/20  293 lb 1.3 oz (132.9 kg)  05/30/15 249 lb (112.9 kg)     GEN:  Well nourished, well developed in no acute distress BMI 47 HEENT: Normal NECK: No JVD; No carotid bruits LYMPHATICS: No lymphadenopathy CARDIAC: RRR, no murmurs, rubs, gallops RESPIRATORY:  Clear to auscultation without rales, wheezing or rhonchi  ABDOMEN: Soft, non-tender, non-distended MUSCULOSKELETAL:  No edema; No deformity  SKIN: Warm and dry NEUROLOGIC:  Alert and oriented x 3 PSYCHIATRIC:  Normal affect     Signed, 06/01/15, MD  04/11/2020 2:43 PM    [facility]

## 2020-04-11 ENCOUNTER — Other Ambulatory Visit: Payer: Self-pay

## 2020-04-11 ENCOUNTER — Ambulatory Visit: Payer: 59 | Admitting: Cardiology

## 2020-04-11 ENCOUNTER — Encounter: Payer: Self-pay | Admitting: Cardiology

## 2020-04-11 VITALS — BP 122/76 | HR 91 | Ht 66.0 in | Wt 293.1 lb

## 2020-04-11 DIAGNOSIS — R002 Palpitations: Secondary | ICD-10-CM | POA: Diagnosis not present

## 2020-04-11 DIAGNOSIS — U071 COVID-19: Secondary | ICD-10-CM

## 2020-04-11 DIAGNOSIS — M25519 Pain in unspecified shoulder: Secondary | ICD-10-CM | POA: Insufficient documentation

## 2020-04-11 NOTE — Patient Instructions (Addendum)
Medication Instructions:  Your physician recommends that you continue on your current medications as directed. Please refer to the Current Medication list given to you today.  *If you need a refill on your cardiac medications before your next appointment, please call your pharmacy*   Lab Work: None If you have labs (blood work) drawn today and your tests are completely normal, you will receive your results only by: Marland Kitchen MyChart Message (if you have MyChart) OR . A paper copy in the mail If you have any lab test that is abnormal or we need to change your treatment, we will call you to review the results.   Testing/Procedures: None   Follow-Up: At [facility], you and your health needs are our priority.  As part of our continuing mission to provide you with exceptional heart care, we have created designated Provider Care Teams.  These Care Teams include your primary Cardiologist (physician) and Advanced Practice Providers (APPs -  Physician Assistants and Nurse Practitioners) who all work together to provide you with the care you need, when you need it.  We recommend signing up for the patient portal called "MyChart".  Sign up information is provided on this After Visit Summary.  MyChart is used to connect with patients for Virtual Visits (Telemedicine).  Patients are able to view lab/test results, encounter notes, upcoming appointments, etc.  Non-urgent messages can be sent to your provider as well.   To learn more about what you can do with MyChart, go to [email or web address].    Your next appointment:   6 month(s)  The format for your next appointment:   In Person  Provider:   Norman Herrlich, MD   Other Instructions  1. Avoid all over-the-counter antihistamines except Claritin/Loratadine and Zyrtec/Cetrizine. 2. Avoid all combination including cold sinus allergies flu decongestant and sleep medications 3. You can use Robitussin DM Mucinex and Mucinex DM for cough. 4. can use  Tylenol aspirin ibuprofen and naproxen but no combinations such as sleep or sinus.  KardiaMobile [email or web address]        FDA-cleared, clinical grade mobile EKG monitor: Lourena Simmonds is the most clinically-validated mobile EKG used by the world's leading cardiac care medical professionals With Basic service, know instantly if your heart rhythm is normal or if atrial fibrillation is detected, and email the last single EKG recording to yourself or your doctor Premium service, available for purchase through the Kardia app for $9.99 per month or $99 per year, includes unlimited history and storage of your EKG recordings, a monthly EKG summary report to share with your doctor, along with the ability to track your blood pressure, activity and weight Includes one KardiaMobile phone clip FREE SHIPPING: Standard delivery 1-3 business days. Orders placed by 11:00am PST will ship that afternoon. Otherwise, will ship next business day. All orders ship via [employer] from [place], [place]

## 2020-12-27 ENCOUNTER — Other Ambulatory Visit: Payer: Self-pay | Admitting: Obstetrics and Gynecology

## 2020-12-27 DIAGNOSIS — N920 Excessive and frequent menstruation with regular cycle: Secondary | ICD-10-CM

## 2021-02-22 DIAGNOSIS — N938 Other specified abnormal uterine and vaginal bleeding: Secondary | ICD-10-CM | POA: Diagnosis present

## 2021-02-22 DIAGNOSIS — I10 Essential (primary) hypertension: Secondary | ICD-10-CM | POA: Diagnosis present

## 2021-02-22 DIAGNOSIS — E559 Vitamin D deficiency, unspecified: Secondary | ICD-10-CM | POA: Diagnosis present

## 2021-02-22 NOTE — H&P (Incomplete Revision)
Lori Pitts is a 41 y.o. female, P: 1-0-0-1 who presents for hysteroscopic removal of an endometrial polyp and placement of the Mirena IUD because of dysfunctional uterine bleeding and polycystic ovarian syndrome.  For 23 years the patient reports irregular uterine bleeding attributed to her polycystic ovarian syndrome.  Over the past year however, her bleeding has become more erratic and disruptive.  She will bleed most days of the month characterized by vaginal spotting, an actual flow and at times only the passage of clots. She was placed on oral contraceptives which improved the regularity of her bleeding however she developed facial drooping and had to discontinue. She was then placed on norethindrone,  but due to paresthesias and the development of a rash that too had to be discontinued.  When the patient has a "typical" menstrual-like flow it will last for 8 days with the change of an ultra-tampon every hour (that often will fall out due to flow). She also reports cramping that is rated 9/10 accompanied by nausea but relieved with Ibuprofen 800 mg.  She admits to urinary frequency and positional dyspareunia but denies problems with bowel function, vaginitis symptoms or urinary discomfort.  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 Given the patient's history of polycystic ovarian syndrome, endometrial polyp and bleeding profile she has decided to proceed with surgical management of the latter and placement of the Mirena IUD for the protection of her uterine lining.   Past Medical History  OB History: G:1;  P: 1-0-0-1;  C-section:-2010  GYN History: menarche: 41 YO;     LMP: 01/31/2021;    Contraception: none;  Denies history of abnormal PAP smear.  Last PAP smear: 2018-normal  Medical History: Essential Hypertension, Asthma, Premenstrual Dysphoric Disorder,  Hypercholesterolemia, Depression, Polycystic Ovarian Syndrome, Lymphedema, Immunglobulin A1 Deficiency, Rosacea and Vitamin D Deficiency   Surgical History:  1990 Adenoidectomy and 2010 C-section Denies problems with anesthesia or history of blood transfusions  Family History: Essential Hypertension, Diabetes Mellitus, Melanoma, ITP, Pulmonary Emphysema, Stroke, Lung Cancer, Brain Cancer , Heart Disease and Prostate Cancer  Social History:  Married and employed by [employer];  Denies tobacco use and does not use alcohol   Medications: Hydrochlorothiazide 25 mg daily Vitamin D 50, 000 IU weekly Metronidazole Topical as directed Zyrtec daily as needed Berberine daily Probiotic daily Singulair 10 mg daily Ventolin 90 HFA Inhaler 2 puffs every 4 hours prn   Allergies  Allergen Reactions   Cherry Anaphylaxis   Benadryl [Diphenhydramine Hcl (Sleep)]     "the liquid medicine makes me break out in hives."   Bupropion     Other reaction(s): Other (See Comments) CARPAL TUNNEL IN BOTH HANDS   Duloxetine     Other reaction(s): Other (See Comments) DRUG INDUCED FEVER   Aspirin Nausea And Vomiting and Rash    HIVES- WITH LIQUID ONLY "the liquid medicine makes me break out in hives."   Chlorpheniramine     Other reaction(s): Rash-Generalized   Dextromethorphan-Guaifenesin Rash   Hydrocodone     Other reaction(s): Rash-Generalized   Pseudoephedrine Rash   Tussin [Guaifenesin] Rash    Other reaction(s): Other (See Comments) HIVES- LIQUID ONLY "the liquid medicine makes me break out in hives."   Tylenol [Acetaminophen] Rash    "the liquid medicine makes me break out in hives."     ROS: Admits to glasses, nausea with pain. left  leg lymphedema and  urinary frequency, but denies headache, vision changes, nasal congestion, dysphagia, tinnitus, dizziness, hoarseness, cough,  chest pain, shortness of breath, vomiting, diarrhea,constipation,  urinary incontinence, urgency  dysuria,  hematuria, vaginitis symptoms, pelvic pain, swelling of joints,easy bruising,  myalgias, arthralgias, skin rashes, unexplained weight loss and except as is mentioned in the history of present illness, patient's review of systems is otherwise negative.    Physical Exam  Bp: 144/96;  Weight: 303 lbs.; Height: 5'6"  BMI: 48.9  Neck: supple without masses or thyromegaly Lungs: clear to auscultation Heart: regular rate and rhythm Abdomen: soft, non-tender and no organomegaly Pelvic:EGBUS- wnl; vagina-normal rugae; uterus-normal size, cervix without lesions or motion tenderness; adnexae-no tenderness or masses Extremities:  no clubbing, cyanosis or edema   Assesment: Dysfunctional Uterine Bleeding                      Polycystic Ovarian Syndrome                      Essential Hypertension                      Vitamin D Deficiency   Disposition:  A discussion was held with patient regarding the indication for her procedure(s) along with the risks, which include but are not limited to: reaction to anesthesia, damage to adjacent organs, infection and excessive bleeding.The patient verbalized understanding of these risks and has consented to proceed with Hysteroscopy, Dilatation, Curettage, Endometrial Polypectomy and Placement of Mirena IUD at [facility] on March 23, 2021.   CSN# [id number]   Teshawn Moan J. Florene Glen, PA-C  for Dr. Dede Query. [place]

## 2021-02-22 NOTE — H&P (Addendum)
Lori Pitts is a 41 y.o. female, P: 1-0-0-1  who presents for hysteroscopic removal of an endometrial polyp and placement of a Mirena IUD because of dysfunctional uterine bleeding and polycystic ovarian syndrome. For 23 years the patient reports irregular uterine bleeding attributed to her polycystic ovarian syndrome. Over the past year however, her bleeding has become more erratic and disruptive. Starting July 2022 she would bleed twice a month,  characterized by vaginal spotting, an actual flow and at times only the passage of clots. She was placed on oral contraceptives which improved the regularity of her bleeding however she developed facial drooping and had to discontinue. She was then placed on norethindrone, but due to paresthesias and the development of a rash that too had to be discontinued. When the patient has a "typical" menstrual-like flow it will last for 8 days with the change of an ultra-tampon every hour (that often will fall out due to flow). She also reports cramping that is rated 9/10 accompanied by nausea & myalgias,  but relieved with Ibuprofen 800 mg. She admits to urinary frequency and positional dyspareunia but denies problems with bowel function, vaginitis symptoms or urinary discomfort. 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 Given the patient's history of polycystic ovarian syndrome, endometrial polyp and bleeding profile she has decided to proceed with surgical management of the latter and placement of the Mirena IUD for the protection of her uterine lining.    Past Medical History  OB History: G:1;  P: 1-0-0-1;  C-section:-2010  GYN History: menarche: 41 YO;     LMP: 03/15/2021;   Contraception: none;  Denies history of abnormal PAP smear.  Last PAP smear: 2020-normal  Medical History: Essential Hypertension, Asthma, Premenstrual Dysphoric  Disorder, Hypercholesterolemia, Depression, Polycystic Ovarian Syndrome, Left Lower Extremity Lymphedema, Immunglobulin A1 Deficiency, Rosacea, Anxiety, Prediabetes, Vitamin D Deficiency, Decreased Hearing and Sleep Apnea.  Surgical History:  1991 Adenoidectomy; 1996 Tympanoplasty and 2010 C-section Denies problems with anesthesia or history of blood transfusions  Family History: Essential Hypertension, Diabetes Mellitus, Melanoma, ITP, Pulmonary Emphysema, Stroke, Lung Cancer, Brain Cancer , Heart Disease and Prostate Cancer  Social History:  Married and employed by [employer];  Denies tobacco use and does not use alcohol   Medications: Hydrochlorothiazide 25 mg daily Vitamin D 50, 000 IU weekly Metronidazole Topical as directed Zyrtec daily as needed Berberine daily Probiotic daily Singulair 10 mg daily Ventolin 90 HFA Inhaler 2 puffs every 4 hours prn   Allergies  Allergen Reactions   Cherry Anaphylaxis   Benadryl [Diphenhydramine Hcl (Sleep)]     "the liquid medicine makes me break out in hives."   Bupropion     Other reaction(s): Other (See Comments) CARPAL TUNNEL IN BOTH HANDS   Duloxetine     Other reaction(s): Other (See Comments) DRUG INDUCED FEVER   Aspirin Nausea And Vomiting and Rash    HIVES- WITH LIQUID ONLY "the liquid medicine makes me break out in hives."   Chlorpheniramine     Other reaction(s): Rash-Generalized   Dextromethorphan-Guaifenesin Rash   Hydrocodone     Other reaction(s): Rash-Generalized   Pseudoephedrine Rash   Tussin [Guaifenesin] Rash    Other reaction(s): Other (See Comments) HIVES- LIQUID ONLY "the liquid medicine makes me break out in hives."   Tylenol [Acetaminophen] Rash    "the liquid medicine makes me break out in hives."   Per  patient, she is able to take Percocet and Ibuprofen as long as it is not in liquid form.  Denies sensitivity to peanuts, shellfish, latex or adhesives. Soy products causes  generalized inflammation.   ROS: Admits to glasses, nausea with pain during menstrual period,  left leg lymphedema, facial rash due to rosacea, right knee swelling on occasion and  urinary frequency, but denies headache, vision changes, nasal congestion, dysphagia, tinnitus, dizziness, hoarseness, cough,  chest pain, shortness of breath, vomiting, diarrhea,constipation,  urinary incontinence, urgency  dysuria, hematuria, vaginitis symptoms, pelvic pain, easy bruising,  myalgias, arthralgias, unexplained weight loss and except as is mentioned in the history of present illness, patient's review of systems is otherwise negative.    Physical Exam  Bp: 124/84;  Weight: 296 lbs.; Height: 5'6"  BMI: 47.9  Neck: supple without masses or thyromegaly Lungs: clear to auscultation Heart: regular rate and rhythm Abdomen: soft, non-tender and no organomegaly Pelvic:EGBUS- wnl; vagina-normal rugae with moderate blood; uterus-normal size, cervix without lesions or motion tenderness; adnexae-no tenderness or masses Extremities:  no clubbing, cyanosis or edema   Assesment: Dysfunctional Uterine Bleeding                      Endometrial Polyp                      Polycystic Ovarian Syndrome                      Essential Hypertension                      Vitamin D Deficiency   Disposition:  A discussion was held with patient regarding the indication for her procedure(s) along with the risks, which include but are not limited to: reaction to anesthesia, damage to adjacent organs, infection and excessive bleeding.The patient verbalized understanding of these risks and has consented to proceed with Hysteroscopy, Dilatation, Curettage, Endometrial Polypectomy and Placement of Mirena IUD at [facility] on March 23, 2021.   CSN# [id number]   Kavita Bartl J. Florene Glen, PA-C  for Dr. Dede Query. [place]

## 2021-02-27 ENCOUNTER — Other Ambulatory Visit: Payer: Self-pay | Admitting: Obstetrics and Gynecology

## 2021-02-27 DIAGNOSIS — N921 Excessive and frequent menstruation with irregular cycle: Secondary | ICD-10-CM

## 2021-02-27 DIAGNOSIS — I10 Essential (primary) hypertension: Secondary | ICD-10-CM

## 2021-02-27 DIAGNOSIS — E559 Vitamin D deficiency, unspecified: Secondary | ICD-10-CM

## 2021-02-27 DIAGNOSIS — Z113 Encounter for screening for infections with a predominantly sexual mode of transmission: Secondary | ICD-10-CM

## 2021-03-09 NOTE — Patient Instructions (Addendum)
DUE TO COVID-19 ONLY ONE VISITOR IS ALLOWED TO COME WITH YOU AND STAY IN THE WAITING ROOM ONLY DURING PRE OP AND PROCEDURE DAY OF SURGERY.   Up to two visitors ages 16+ are allowed at one time in a patient's room.  The visitors may rotate out with other people throughout the day.  Additionally, up to two children between the ages of 56 and 61 are allowed and do not count toward the number of allowed visitors.  Children within this age range must be accompanied by an adult visitor.  One adult visitor may remain with the patient overnight and must be in the room by 8 PM.            Your procedure is scheduled on: 03/23/21   Report to [facility] Main  Entrance  Report to admitting at 1:00 PM     Call this number if you have problems the morning of surgery [phone]  No food after midnight.    You may have clear liquid until 12:00 PM    At 11:30 AM drink pre surgery drink.  G2   Nothing by mouth after 12:00 PM   CLEAR LIQUID DIET  Foods Allowed                                                                     Exclude  Water, Black Coffee, and tea, regular and decaf             ALL SOLID FOODS! Plain Jell-O in any flavor  (No red)                                     Fruit ices (not with fruit pulp)                                            MILK, SOUPS, Iced Popsicles (No red)                                                                            Apple juices                                                                         ORANGE JUICE Sports drinks like Gatorade (No red)                                  Lightly seasoned clear broth or consume(fat free) Sugar/Sweetner  BRUSH YOUR TEETH MORNING OF SURGERY AND RINSE YOUR MOUTH OUT, NO CHEWING GUM CANDY OR MINTS.     Take these medicines the morning of surgery with A SIP OF WATER: Use your inhalers and bring them with you                                You may not have any metal on your body including hair  pins and piercings .             Do not wear jewelry,  lotions, powders, deodorant or perfume/cologne.             Do not wear make up .             Do not wear nail polish or nail product on your fingernails or toenails .               Do not shave legs or under arms 48 hours prior to surgery.                 Do not bring valuables to the hospital. [place].   Contacts, dentures or bridgework may not be worn into surgery.       Patients discharged the day of surgery will not be allowed to drive home.   IF YOU ARE HAVING SURGERY AND GOING HOME THE SAME DAY, YOU MUST HAVE AN ADULT TO DRIVE YOU HOME AND BE WITH YOU FOR 24 HOURS.  YOU MAY GO HOME BY TAXI OR UBER OR ORTHERWISE, BUT AN ADULT MUST ACCOMPANY YOU HOME AND STAY WITH YOU FOR 24 HOURS.   _____________________________________________________________________             [facility] - Preparing for Surgery Before surgery, you can play an important role.  Because skin is not sterile, your skin needs to be as free of germs as possible.  You can reduce the number of germs on your skin by washing with CHG (chlorahexidine gluconate) soap before surgery.  CHG is an antiseptic cleaner which kills germs and bonds with the skin to continue killing germs even after washing. Please DO NOT use if you have an allergy to CHG or antibacterial soaps.   If your skin becomes reddened/irritated stop using . You may use Dial soap or any antibacterial soap instead.  Women :Do not shave (including legs and underarms) for at least 48 hours prior to the first CHG shower.   Men: You may shave your face/neck.  Please follow these instructions carefully:  1.  Shower with CHG Soap the night before surgery and the  morning of Surgery.  2.  If you choose to wash your hair, wash your hair first as usual with your  normal  shampoo.  3.  After you shampoo, rinse your hair and body thoroughly to remove the  shampoo.                             4.  Use CHG as you would any other liquid soap.  You can apply chg directly  to the skin and wash                       Gently with a scrungie or clean washcloth.  5.  Apply the CHG Soap to your body ONLY FROM  THE NECK DOWN.   Do not use on face/ open                           Wound or open sores. Avoid contact with eyes, ears mouth and genitals (private parts).                       Wash face,  Genitals (private parts) with your normal soap.             6.  Wash thoroughly, paying special attention to the area where your surgery  will be performed.  7.  Thoroughly rinse your body with warm water from the neck down.  8.  DO NOT shower/wash with your normal soap after using and rinsing off  the CHG Soap.             9.  Pat yourself dry with a clean towel.            10.  Wear clean pajamas.            11.  Place clean sheets on your bed the night of your first shower and do not  sleep with pets.  Day of Surgery : Do not apply any lotions/deodorants the morning of surgery.  Please wear clean clothes to the hospital/surgery center.  FAILURE TO FOLLOW THESE INSTRUCTIONS MAY RESULT IN THE CANCELLATION OF YOUR SURGERY   PATIENT SIGNATURE_________________________________    ________________________________________________________________________

## 2021-03-10 ENCOUNTER — Encounter (HOSPITAL_COMMUNITY)
Admission: RE | Admit: 2021-03-10 | Discharge: 2021-03-10 | Disposition: A | Discharge status: Home / Self Care | Payer: 59 | Source: Ambulatory Visit | Attending: Obstetrics and Gynecology | Admitting: Obstetrics and Gynecology

## 2021-03-10 ENCOUNTER — Encounter (HOSPITAL_COMMUNITY): Payer: Self-pay

## 2021-03-10 ENCOUNTER — Other Ambulatory Visit: Payer: Self-pay

## 2021-03-10 VITALS — BP 135/54 | HR 100 | Temp 98.4°F | Resp 18 | Ht 66.0 in | Wt 298.0 lb

## 2021-03-10 DIAGNOSIS — N921 Excessive and frequent menstruation with irregular cycle: Secondary | ICD-10-CM | POA: Diagnosis not present

## 2021-03-10 DIAGNOSIS — E559 Vitamin D deficiency, unspecified: Secondary | ICD-10-CM | POA: Diagnosis not present

## 2021-03-10 DIAGNOSIS — I10 Essential (primary) hypertension: Secondary | ICD-10-CM

## 2021-03-10 DIAGNOSIS — Z113 Encounter for screening for infections with a predominantly sexual mode of transmission: Secondary | ICD-10-CM

## 2021-03-10 DIAGNOSIS — R7303 Prediabetes: Secondary | ICD-10-CM

## 2021-03-10 DIAGNOSIS — Z01812 Encounter for preprocedural laboratory examination: Secondary | ICD-10-CM | POA: Insufficient documentation

## 2021-03-10 DIAGNOSIS — Z01818 Encounter for other preprocedural examination: Secondary | ICD-10-CM

## 2021-03-10 HISTORY — DX: Prediabetes: R73.03

## 2021-03-10 HISTORY — DX: Essential (primary) hypertension: I10

## 2021-03-10 HISTORY — DX: Polycystic ovarian syndrome: E28.2

## 2021-03-10 HISTORY — DX: Sleep apnea, unspecified: G47.30

## 2021-03-10 LAB — COMPREHENSIVE METABOLIC PANEL
ALT: 29 U/L (ref 0–44)
AST: 26 U/L (ref 15–41)
Albumin: 3.6 g/dL (ref 3.5–5.0)
Alkaline Phosphatase: 77 U/L (ref 38–126)
Anion gap: 10 (ref 5–15)
BUN: 14 mg/dL (ref 6–20)
CO2: 27 mmol/L (ref 22–32)
Calcium: 9.2 mg/dL (ref 8.9–10.3)
Chloride: 99 mmol/L (ref 98–111)
Creatinine, Ser: 0.86 mg/dL (ref 0.44–1.00)
GFR, Estimated: >60 mL/min (ref >60)
Glucose, Bld: 96 mg/dL (ref 70–99)
Potassium: 2.9 mmol/L — ABNORMAL LOW (ref 3.5–5.1)
Sodium: 136 mmol/L (ref 135–145)
Total Bilirubin: 0.4 mg/dL (ref 0.3–1.2)
Total Protein: 7.6 g/dL (ref 6.5–8.1)

## 2021-03-10 LAB — CBC
HCT: 38.3 % (ref 36.0–46.0)
Hemoglobin: 12.3 g/dL (ref 12.0–15.0)
MCH: 26.9 pg (ref 26.0–34.0)
MCHC: 32.1 g/dL (ref 30.0–36.0)
MCV: 83.8 fL (ref 80.0–100.0)
Platelets: 379 10*3/uL (ref 150–400)
RBC: 4.57 MIL/uL (ref 3.87–5.11)
RDW: 13.9 % (ref 11.5–15.5)
WBC: 9.2 10*3/uL (ref 4.0–10.5)
nRBC: 0 % (ref 0.0–0.2)

## 2021-03-10 LAB — TYPE AND SCREEN
ABO/RH(D): A POS
Antibody Screen: NEGATIVE

## 2021-03-10 LAB — RPR: RPR Ser Ql: NONREACTIVE

## 2021-03-10 NOTE — Progress Notes (Signed)
COVID test-NA   Bowel prep reminder:NA  PCP - Reinaldo Nodal PA Cardiologist -   Chest x-ray - no EKG - 04/11/20-epic Stress Test - no ECHO - no Cardiac Cath - no Pacemaker/ICD device last checked:NA  Sleep Study - yes CPAP - no  Fasting Blood Sugar - Doesn't test prediabetic vs insulin resistant/ PCOD Checks Blood Sugar _____ times a day  Blood Thinner Instructions:NA Aspirin Instructions: Last Dose:  Anesthesia review: no  Patient denies shortness of breath, fever, cough and chest pain at PAT appointment Pt's BMI is 48.1 she gets SOB walking and climbing stairs. This is her baseline.  Patient verbalized understanding of instructions that were given to them at the PAT appointment. Patient was also instructed that they will need to review over the PAT instructions again at home before surgery. yes

## 2021-03-11 LAB — HEMOGLOBIN A1C
Hgb A1c MFr Bld: 5.8 % — ABNORMAL HIGH (ref 4.8–5.6)
Mean Plasma Glucose: 120 mg/dL

## 2021-03-15 ENCOUNTER — Telehealth: Payer: Self-pay | Admitting: Obstetrics and Gynecology

## 2021-03-22 NOTE — Anesthesia Preprocedure Evaluation (Addendum)
Anesthesia Evaluation  ?Patient identified by MRN, date of birth, ID band ?Patient awake ? ? ? ?Reviewed: ?Allergy & Precautions, Patient's Chart, lab work & pertinent test results ? ?Airway ?Mallampati: II ? ?TM Distance: >3 FB ?Neck ROM: Full ? ? ? Dental ?no notable dental hx. ? ?  ?Pulmonary ?asthma , sleep apnea and Continuous Positive Airway Pressure Ventilation ,  ?  ?Pulmonary exam normal ? ? ? ? ? ? ? Cardiovascular ?hypertension, Pt. on medications ? ?Rhythm:Regular Rate:Normal ? ? ?  ?Neuro/Psych ?Anxiety   ? GI/Hepatic ?  ?Endo/Other  ?negative endocrine ROSMorbid obesity (BMI 48.1) ? Renal/GU ?Lab Results ?     Component                Value               Date                 ?     CREATININE               0.86                03/10/2021           ?     K                        2.9 (L)             03/10/2021           ?      ? ?  ?Musculoskeletal ? ? Abdominal ?Normal abdominal exam  (+)   ?Peds ? Hematology ?Lab Results ?     Component                Value               Date                   ?     HGB                      12.3                03/10/2021           ?     HCT                      38.3                03/10/2021               ?     PLT                      379                 03/10/2021           ?   ?Anesthesia Other Findings ?ALL: see list ? Reproductive/Obstetrics ? ?  ? ? ? ? ? ? ? ? ? ? ? ? ? ?  ?  ? ? ? ? ? ? ? ?Anesthesia Physical ?Anesthesia Plan ? ?ASA: 3 ? ?Anesthesia Plan: General  ? ?Post-op Pain Management: Toradol IV (intra-op)* and Precedex  ? ?Induction: Intravenous ? ?PONV Risk Score and Plan: 4 or greater and Midazolam, Ondansetron, Dexamethasone and Treatment may vary due to age or medical condition ? ?Airway Management Planned:  LMA ? ?Additional Equipment: None ? ?Intra-op Plan:  ? ?Post-operative Plan: Extubation in OR ? ?Informed Consent: I have reviewed the patients History and Physical, chart, labs and discussed the procedure  including the risks, benefits and alternatives for the proposed anesthesia with the patient or authorized representative who has indicated his/her understanding and acceptance.  ? ? ? ?Dental advisory given ? ?Plan Discussed with: CRNA ? ?Anesthesia Plan Comments: (Lab Results ?     Component                Value               Date                 ?     NA                       136                 03/10/2021           ?     K                        2.9 (L)             03/10/2021           ?     CO2                      27                  03/10/2021           ?     GLUCOSE                  96                  03/10/2021           ?     BUN                      14                  03/10/2021           ?     CREATININE               0.86                03/10/2021           ?     CALCIUM                  9.2                 03/10/2021           ?     GFRNONAA                 >60                 03/10/2021          ?Lab Results ?     Component                Value               Date                 ?  NA                       136                 03/23/2021           ?     K                        3.5                 03/23/2021           ?     CO2                      29                  03/23/2021           ?     GLUCOSE                  92                  03/23/2021           ?     BUN                      11                  03/23/2021           ?     CREATININE               0.93                03/23/2021           ?     CALCIUM                  8.7 (L)             03/23/2021           ?     GFRNONAA                 >60                 03/23/2021           )  ? ? ? ? ?Anesthesia Quick Evaluation ? ?

## 2021-03-23 ENCOUNTER — Other Ambulatory Visit: Payer: Self-pay

## 2021-03-23 ENCOUNTER — Encounter (HOSPITAL_COMMUNITY): Payer: Self-pay | Admitting: Obstetrics and Gynecology

## 2021-03-23 ENCOUNTER — Ambulatory Visit (HOSPITAL_BASED_OUTPATIENT_CLINIC_OR_DEPARTMENT_OTHER): Payer: 59 | Admitting: Anesthesiology

## 2021-03-23 ENCOUNTER — Ambulatory Visit (HOSPITAL_COMMUNITY): Payer: 59 | Admitting: Anesthesiology

## 2021-03-23 ENCOUNTER — Ambulatory Visit (HOSPITAL_COMMUNITY)
Admission: RE | Admit: 2021-03-23 | Discharge: 2021-03-23 | Disposition: A | Discharge status: Home / Self Care | Payer: 59 | Source: Ambulatory Visit | Attending: Obstetrics and Gynecology | Admitting: Obstetrics and Gynecology

## 2021-03-23 ENCOUNTER — Encounter (HOSPITAL_COMMUNITY)
Admission: RE | Disposition: A | Discharge status: Home / Self Care | Payer: Self-pay | Source: Ambulatory Visit | Attending: Obstetrics and Gynecology

## 2021-03-23 DIAGNOSIS — E282 Polycystic ovarian syndrome: Secondary | ICD-10-CM | POA: Diagnosis not present

## 2021-03-23 DIAGNOSIS — N840 Polyp of corpus uteri: Secondary | ICD-10-CM | POA: Insufficient documentation

## 2021-03-23 DIAGNOSIS — N938 Other specified abnormal uterine and vaginal bleeding: Secondary | ICD-10-CM | POA: Insufficient documentation

## 2021-03-23 DIAGNOSIS — N920 Excessive and frequent menstruation with regular cycle: Secondary | ICD-10-CM | POA: Diagnosis not present

## 2021-03-23 DIAGNOSIS — G4733 Obstructive sleep apnea (adult) (pediatric): Secondary | ICD-10-CM

## 2021-03-23 DIAGNOSIS — E559 Vitamin D deficiency, unspecified: Secondary | ICD-10-CM

## 2021-03-23 DIAGNOSIS — G4730 Sleep apnea, unspecified: Secondary | ICD-10-CM | POA: Diagnosis not present

## 2021-03-23 DIAGNOSIS — I10 Essential (primary) hypertension: Secondary | ICD-10-CM

## 2021-03-23 DIAGNOSIS — Z01818 Encounter for other preprocedural examination: Secondary | ICD-10-CM

## 2021-03-23 DIAGNOSIS — Z6841 Body Mass Index (BMI) 40.0 and over, adult: Secondary | ICD-10-CM | POA: Diagnosis not present

## 2021-03-23 DIAGNOSIS — R7303 Prediabetes: Secondary | ICD-10-CM

## 2021-03-23 DIAGNOSIS — Z9989 Dependence on other enabling machines and devices: Secondary | ICD-10-CM | POA: Diagnosis not present

## 2021-03-23 DIAGNOSIS — N921 Excessive and frequent menstruation with irregular cycle: Secondary | ICD-10-CM

## 2021-03-23 HISTORY — PX: CERVICAL POLYPECTOMY: SHX88

## 2021-03-23 HISTORY — PX: HYSTEROSCOPY WITH D & C: SHX1775

## 2021-03-23 HISTORY — PX: INTRAUTERINE DEVICE (IUD) INSERTION: SHX5877

## 2021-03-23 LAB — BASIC METABOLIC PANEL WITH GFR
Anion gap: 8 (ref 5–15)
BUN: 11 mg/dL (ref 6–20)
CO2: 29 mmol/L (ref 22–32)
Calcium: 8.7 mg/dL — ABNORMAL LOW (ref 8.9–10.3)
Chloride: 99 mmol/L (ref 98–111)
Creatinine, Ser: 0.93 mg/dL (ref 0.44–1.00)
GFR, Estimated: >60 mL/min
Glucose, Bld: 92 mg/dL (ref 70–99)
Potassium: 3.5 mmol/L (ref 3.5–5.1)
Sodium: 136 mmol/L (ref 135–145)

## 2021-03-23 LAB — PREGNANCY, URINE: Preg Test, Ur: NEGATIVE

## 2021-03-23 LAB — ABO/RH: ABO/RH(D): A POS

## 2021-03-23 LAB — GLUCOSE, CAPILLARY: Glucose-Capillary: 88 mg/dL (ref 70–99)

## 2021-03-23 SURGERY — Surgical Case
Anesthesia: *Unknown

## 2021-03-23 SURGERY — DILATATION AND CURETTAGE /HYSTEROSCOPY
Anesthesia: General | Site: Uterus

## 2021-03-23 MED ORDER — ENSURE PRE-SURGERY PO LIQD: 296.0000 mL | Freq: Once | ORAL | Status: DC

## 2021-03-23 MED ORDER — LIDOCAINE HCL (CARDIAC) PF 100 MG/5ML IV SOSY
PREFILLED_SYRINGE | INTRAVENOUS | Status: DC | PRN
  Administered 2021-03-23: 100 mg via INTRAVENOUS

## 2021-03-23 MED ORDER — AMISULPRIDE (ANTIEMETIC) 5 MG/2ML IV SOLN: 10.0000 mg | Freq: Once | INTRAVENOUS | Status: DC | PRN

## 2021-03-23 MED ORDER — LACTATED RINGERS IV SOLN: INTRAVENOUS | Status: DC

## 2021-03-23 MED ORDER — MIDAZOLAM HCL 2 MG/2ML IJ SOLN
INTRAMUSCULAR | Status: AC
  Filled 2021-03-23: qty 2

## 2021-03-23 MED ORDER — OXYCODONE HCL 5 MG/5ML PO SOLN: 5.0000 mg | Freq: Once | ORAL | Status: DC | PRN

## 2021-03-23 MED ORDER — PROPOFOL 10 MG/ML IV BOLUS
INTRAVENOUS | Status: DC | PRN
  Administered 2021-03-23: 60 mg via INTRAVENOUS

## 2021-03-23 MED ORDER — FENTANYL CITRATE (PF) 100 MCG/2ML IJ SOLN
INTRAMUSCULAR | Status: DC | PRN
  Administered 2021-03-23: 50 ug via INTRAVENOUS

## 2021-03-23 MED ORDER — ONDANSETRON HCL 4 MG/2ML IJ SOLN
INTRAMUSCULAR | Status: DC | PRN
  Administered 2021-03-23: 4 mg via INTRAVENOUS

## 2021-03-23 MED ORDER — MIDAZOLAM HCL 5 MG/5ML IJ SOLN
INTRAMUSCULAR | Status: DC | PRN
  Administered 2021-03-23: 2 mg via INTRAVENOUS

## 2021-03-23 MED ORDER — LIDOCAINE HCL (PF) 2 % IJ SOLN
INTRAMUSCULAR | Status: AC
  Filled 2021-03-23: qty 5

## 2021-03-23 MED ORDER — POVIDONE-IODINE 10 % EX SWAB: 2.0000 "application " | Freq: Once | CUTANEOUS | Status: DC

## 2021-03-23 MED ORDER — LEVONORGESTREL 20 MCG/DAY IU IUD
1.0000 | INTRAUTERINE_SYSTEM | Freq: Once | INTRAUTERINE | Status: AC
Start: 2021-03-23 — End: 2021-03-23
  Administered 2021-03-23: 1 via INTRAUTERINE
  Filled 2021-03-23 (×2): qty 1

## 2021-03-23 MED ORDER — ACETAMINOPHEN 10 MG/ML IV SOLN: 1000.0000 mg | Freq: Once | INTRAVENOUS | Status: DC | PRN

## 2021-03-23 MED ORDER — CHLORHEXIDINE GLUCONATE 0.12 % MT SOLN
15.0000 mL | Freq: Once | OROMUCOSAL | Status: AC
  Administered 2021-03-23: 14:00:00 15 mL via OROMUCOSAL

## 2021-03-23 MED ORDER — HYDROMORPHONE HCL 1 MG/ML IJ SOLN: 0.2500 mg | INTRAMUSCULAR | Status: DC | PRN

## 2021-03-23 MED ORDER — CHLOROPROCAINE HCL 1 % IJ SOLN
30.0000 mL | Freq: Once | INTRAMUSCULAR | Status: AC
  Administered 2021-03-23: 16:00:00 7 mL
  Filled 2021-03-23: qty 30

## 2021-03-23 MED ORDER — ACETAMINOPHEN 500 MG PO TABS
1000.0000 mg | ORAL_TABLET | ORAL | Status: AC
  Administered 2021-03-23: 14:00:00 1000 mg via ORAL
  Filled 2021-03-23: qty 2

## 2021-03-23 MED ORDER — ONDANSETRON HCL 4 MG/2ML IJ SOLN
INTRAMUSCULAR | Status: AC
  Filled 2021-03-23: qty 2

## 2021-03-23 MED ORDER — FENTANYL CITRATE (PF) 100 MCG/2ML IJ SOLN
INTRAMUSCULAR | Status: AC
  Filled 2021-03-23: qty 2

## 2021-03-23 MED ORDER — GABAPENTIN 300 MG PO CAPS
300.0000 mg | ORAL_CAPSULE | ORAL | Status: AC
  Administered 2021-03-23: 14:00:00 300 mg via ORAL
  Filled 2021-03-23: qty 1

## 2021-03-23 MED ORDER — ONDANSETRON HCL 4 MG/2ML IJ SOLN: 4.0000 mg | Freq: Once | INTRAMUSCULAR | Status: DC | PRN

## 2021-03-23 MED ORDER — ORAL CARE MOUTH RINSE: 15.0000 mL | Freq: Once | OROMUCOSAL | Status: AC

## 2021-03-23 MED ORDER — SILVER NITRATE-POT NITRATE 75-25 % EX MISC
CUTANEOUS | Status: AC
  Filled 2021-03-23: qty 10

## 2021-03-23 MED ORDER — DEXAMETHASONE SODIUM PHOSPHATE 10 MG/ML IJ SOLN
INTRAMUSCULAR | Status: AC
  Filled 2021-03-23: qty 1

## 2021-03-23 MED ORDER — OXYCODONE HCL 5 MG PO TABS: 5.0000 mg | ORAL_TABLET | Freq: Once | ORAL | Status: DC | PRN

## 2021-03-23 MED ORDER — DEXAMETHASONE SODIUM PHOSPHATE 10 MG/ML IJ SOLN
INTRAMUSCULAR | Status: DC | PRN
Start: 2021-03-23 — End: 2021-03-23
  Administered 2021-03-23: 10 mg via INTRAVENOUS

## 2021-03-23 SURGICAL SUPPLY — 18 items
BAG COUNTER SPONGE SURGICOUNT (BAG) IMPLANT
BAG SPNG CNTER NS LX DISP (BAG)
CATH ROBINSON RED A/P 16FR (CATHETERS) ×3 IMPLANT
CLOTH BEACON ORANGE TIMEOUT ST (SAFETY) ×3 IMPLANT
COVER SURGICAL LIGHT HANDLE (MISCELLANEOUS) ×3 IMPLANT
DEVICE MYOSURE LITE (MISCELLANEOUS) ×1 IMPLANT
DILATOR CANAL MILEX (MISCELLANEOUS) IMPLANT
GAUZE 4X4 16PLY ~~LOC~~+RFID DBL (SPONGE) ×1 IMPLANT
GLOVE SURG POLYISO LF SZ6.5 (GLOVE) ×6 IMPLANT
GOWN STRL REUS W/TWL LRG LVL3 (GOWN DISPOSABLE) ×4 IMPLANT
IV NS IRRIG 3000ML ARTHROMATIC (IV SOLUTION) ×3 IMPLANT
KIT PROCEDURE FLUENT (KITS) ×3 IMPLANT
MANIFOLD NEPTUNE II (INSTRUMENTS) ×3 IMPLANT
Mirena ×1 IMPLANT
PACK VAGINAL MINOR WOMEN LF (CUSTOM PROCEDURE TRAY) ×3 IMPLANT
PAD OB MATERNITY 4.3X12.25 (PERSONAL CARE ITEMS) ×3 IMPLANT
PAD PREP 24X48 CUFFED NSTRL (MISCELLANEOUS) ×3 IMPLANT
TOWEL OR 17X26 10 PK STRL BLUE (TOWEL DISPOSABLE) ×3 IMPLANT

## 2021-03-23 NOTE — Interval H&P Note (Signed)
History and Physical Interval Note: ? ?03/23/2021 ?3:13 PM ? ?Lori Pitts  has presented today for surgery, with the diagnosis of MENORRHAGIA,ENDOMETRIAL POLYP, MORBID OBESITY.  The various methods of treatment have been discussed with the patient and family. After consideration of risks, benefits and other options for treatment, the patient has consented to  Procedure(s): ?DILATATION AND CURETTAGE /HYSTEROSCOPY (N/A) ?CERVICAL POLYPECTOMY (N/A) ?INTRAUTERINE DEVICE (IUD) INSERTION WITH MIRENA (N/A) as a surgical intervention.  The patient's history has been reviewed, patient examined, no change in status, stable for surgery.  I have reviewed the patient's chart and labs.  Questions were answered to the patient's satisfaction.   ? ? ?Dois Davenport A Tadeusz Stahl ? ? ?

## 2021-03-23 NOTE — Anesthesia Postprocedure Evaluation (Signed)
Anesthesia Post Note ? ?Patient: Lori Pitts ? ?Procedure(s) Performed: DILATATION AND CURETTAGE /HYSTEROSCOPY (Uterus) ?CERVICAL POLYPECTOMY (Cervix) ?INTRAUTERINE DEVICE (IUD) INSERTION WITH MIRENA (Uterus) ? ?  ? ?Patient location during evaluation: PACU ?Anesthesia Type: General ?Level of consciousness: awake and alert ?Pain management: pain level controlled ?Vital Signs Assessment: post-procedure vital signs reviewed and stable ?Respiratory status: spontaneous breathing, nonlabored ventilation, respiratory function stable and patient connected to nasal cannula oxygen ?Cardiovascular status: blood pressure returned to baseline and stable ?Postop Assessment: no apparent nausea or vomiting ?Anesthetic complications: no ? ? ?No notable events documented. ? ?Last Vitals:  ?Vitals:  ? 03/23/21 1700 03/23/21 1727  ?BP: 129/78 (!) 164/99  ?Pulse: 77 93  ?Resp: 12 16  ?Temp:    ?SpO2: 93% 100%  ?  ?Last Pain:  ?Vitals:  ? 03/23/21 1727  ?TempSrc:   ?PainSc: 0-No pain  ? ? ?  ?  ?  ?  ?  ?  ? ?Lori Pitts ? ? ? ? ?

## 2021-03-23 NOTE — Op Note (Signed)
Preop diagnosis: abnormal uterine bleeding ? ?Postop diagnosis: endometrial polyp ? ?Anesthesia: general with laryngeal mask ? ?Anesthesiologist: Dr. Nance Pew ? ?Procedure: Hysteroscopy, resection of endometrial polyps, endometrial curettage and insertion of Mirena ? ?Surgeon: Dr. Dois Davenport Connar Keating ? ?Procedure: After being informed of the planned procedure with possible complications including bleeding, infection and uterine perforation, informed consent was obtained and patient was taken to or #3. ? ?She was given general anesthesia without complication. She was placed in a dorsal decubitus position, prepped and draped in the sterile fashion and a Foley catheter was inserted in her bladder. Pelvic exam reveals anteverted uterus with normal adnexa. ? ?A weighted speculum is inserted in the vagina. The cervix was grasped with a tenaculum forcep placed on the anterior lip.We proceed with a paracervical block using 1% Nesacaine, 7 cc. Uterus is sounded at 9. The cervix is then easily dilated using Hegar dilator until # 23. This allows for easy placement of a diagnostic hysteroscope. With perfusion of Normal Saline at a maximum pressure of 100 mmHg, we are able to evaluate the entire uterine cavity. ? ?Observation: Normal appearing endometrium, 2 normal tubal ostia, 1.5 cm endometrial polyp lower posterior uterine wall. ? ?We proceed with the resection of the endometrial polyp using Myosure Light without difficulty.  Using a sharp curette, we proceed with curettage of the endometrial cavity which returns a small amount of normal-appearing endometrium. ?A Mirena IUD is easily inserted and strings are trimmed. ? ?Instruments are then removed. Instrument and sponge count is complete x2. Estimated blood loss is minimal. Water deficit is 170 cc of Normal Saline. ? ?The procedure is very well tolerated by the patient who is taken to recovery room in a well and stable condition. ? ?Specimen: Endometrial polyps and endometrial  curettings sent to pathology. ? ?

## 2021-03-23 NOTE — Discharge Instructions (Signed)
POST-OPERATIVE INSTRUCTIONS TO PATIENT ? ?Call [facility]  [phone])  for excessive pain, bleeding or temperature greater than or equal to 100.4 degrees (orally).   ? ?No driving for 24 hours ?No sexual activity for 1 week ? ?Pain management: ? ?Use Ibuprofen 600 mg every 6 hours as needed for cramping.     ? ?Diet: normal ? ?Bathing: may shower day after surgery ? ?Wound Care: n/a ? ?Return to Dr. Estanislado Pandy as scheduled ? ?Return to work: 03/27/21 ? ? ? ?Crist Fat Bronislaw Switzer MD ?03/23/2021 4:24 PM ? ?

## 2021-03-23 NOTE — Anesthesia Procedure Notes (Signed)
Date/Time: 03/23/2021 3:26 PM ?Performed by: Doran Clay, CRNA ?Pre-anesthesia Checklist: Patient identified, Emergency Drugs available, Suction available, Patient being monitored and Timeout performed ?Patient Re-evaluated:Patient Re-evaluated prior to induction ?Oxygen Delivery Method: Circle system utilized ?Preoxygenation: Pre-oxygenation with 100% oxygen ?Induction Type: IV induction ?LMA: LMA inserted ?LMA Size: 4.0 ?Tube type: Oral ?Number of attempts: 1 ?Placement Confirmation: positive ETCO2 and breath sounds checked- equal and bilateral ?Tube secured with: Tape ?Dental Injury: Teeth and Oropharynx as per pre-operative assessment  ? ? ? ? ?

## 2021-03-23 NOTE — Transfer of Care (Signed)
Immediate Anesthesia Transfer of Care Note ? ?Patient: Lori Pitts ? ?Procedure(s) Performed: DILATATION AND CURETTAGE /HYSTEROSCOPY (Uterus) ?CERVICAL POLYPECTOMY (Cervix) ?INTRAUTERINE DEVICE (IUD) INSERTION WITH MIRENA (Uterus) ? ?Patient Location: PACU ? ?Anesthesia Type:General ? ?Level of Consciousness: sedated ? ?Airway & Oxygen Therapy: Patient Spontanous Breathing and Patient connected to face mask oxygen ? ?Post-op Assessment: Report given to RN and Post -op Vital signs reviewed and stable ? ?Post vital signs: Reviewed and stable ? ?Last Vitals:  ?Vitals Value Taken Time  ?BP    ?Temp    ?Pulse    ?Resp    ?SpO2    ? ? ?Last Pain:  ?Vitals:  ? 03/23/21 1336  ?TempSrc: Oral  ?PainSc:   ?   ? ?  ? ?Complications: No notable events documented. ?

## 2021-03-24 ENCOUNTER — Encounter (HOSPITAL_COMMUNITY): Payer: Self-pay | Admitting: Obstetrics and Gynecology

## 2021-03-27 LAB — SURGICAL PATHOLOGY

## 2022-08-14 ENCOUNTER — Other Ambulatory Visit (HOSPITAL_COMMUNITY): Payer: Self-pay | Admitting: Endocrinology

## 2022-08-14 DIAGNOSIS — E7800 Pure hypercholesterolemia, unspecified: Secondary | ICD-10-CM

## 2022-08-24 ENCOUNTER — Ambulatory Visit (HOSPITAL_COMMUNITY)
Admission: RE | Admit: 2022-08-24 | Discharge: 2022-08-24 | Disposition: A | Discharge status: Home / Self Care | Payer: 59 | Source: Ambulatory Visit | Attending: Endocrinology | Admitting: Endocrinology

## 2022-08-24 DIAGNOSIS — E7800 Pure hypercholesterolemia, unspecified: Secondary | ICD-10-CM

## 2023-02-12 ENCOUNTER — Other Ambulatory Visit (HOSPITAL_BASED_OUTPATIENT_CLINIC_OR_DEPARTMENT_OTHER): Payer: Self-pay | Admitting: Endocrinology

## 2023-02-12 DIAGNOSIS — E7800 Pure hypercholesterolemia, unspecified: Secondary | ICD-10-CM

## 2023-03-04 ENCOUNTER — Other Ambulatory Visit (HOSPITAL_COMMUNITY): Payer: 59

## 2023-03-04 ENCOUNTER — Encounter (HOSPITAL_COMMUNITY): Payer: Self-pay

## 2023-03-25 ENCOUNTER — Other Ambulatory Visit (HOSPITAL_BASED_OUTPATIENT_CLINIC_OR_DEPARTMENT_OTHER): Payer: Self-pay | Admitting: Endocrinology

## 2023-03-25 ENCOUNTER — Other Ambulatory Visit: Payer: Self-pay | Admitting: Obstetrics and Gynecology

## 2023-03-25 DIAGNOSIS — E7800 Pure hypercholesterolemia, unspecified: Secondary | ICD-10-CM

## 2023-03-25 DIAGNOSIS — Z1231 Encounter for screening mammogram for malignant neoplasm of breast: Secondary | ICD-10-CM

## 2023-04-17 ENCOUNTER — Ambulatory Visit

## 2023-04-18 ENCOUNTER — Other Ambulatory Visit

## 2023-04-19 ENCOUNTER — Ambulatory Visit (HOSPITAL_BASED_OUTPATIENT_CLINIC_OR_DEPARTMENT_OTHER)
Admission: RE | Admit: 2023-04-19 | Discharge: 2023-04-19 | Disposition: A | Discharge status: Home / Self Care | Payer: Self-pay | Source: Ambulatory Visit | Attending: Endocrinology | Admitting: Endocrinology

## 2023-04-19 DIAGNOSIS — E7800 Pure hypercholesterolemia, unspecified: Secondary | ICD-10-CM

## 2023-04-24 ENCOUNTER — Ambulatory Visit
Admission: RE | Admit: 2023-04-24 | Discharge: 2023-04-24 | Disposition: A | Discharge status: Home / Self Care | Source: Ambulatory Visit | Attending: Obstetrics and Gynecology | Admitting: Obstetrics and Gynecology

## 2023-04-24 DIAGNOSIS — Z1231 Encounter for screening mammogram for malignant neoplasm of breast: Secondary | ICD-10-CM

## 2023-04-29 ENCOUNTER — Other Ambulatory Visit: Payer: Self-pay | Admitting: Obstetrics and Gynecology

## 2023-04-29 DIAGNOSIS — R928 Other abnormal and inconclusive findings on diagnostic imaging of breast: Secondary | ICD-10-CM

## 2023-05-10 ENCOUNTER — Other Ambulatory Visit: Payer: Self-pay | Admitting: Obstetrics and Gynecology

## 2023-05-10 ENCOUNTER — Ambulatory Visit
Admission: RE | Admit: 2023-05-10 | Discharge: 2023-05-10 | Disposition: A | Discharge status: Home / Self Care | Source: Ambulatory Visit | Attending: Obstetrics and Gynecology | Admitting: Obstetrics and Gynecology

## 2023-05-10 DIAGNOSIS — R928 Other abnormal and inconclusive findings on diagnostic imaging of breast: Secondary | ICD-10-CM

## 2023-05-10 DIAGNOSIS — N6489 Other specified disorders of breast: Secondary | ICD-10-CM

## 2023-09-05 ENCOUNTER — Other Ambulatory Visit (HOSPITAL_BASED_OUTPATIENT_CLINIC_OR_DEPARTMENT_OTHER): Payer: Self-pay | Admitting: Endocrinology

## 2023-09-05 DIAGNOSIS — E7800 Pure hypercholesterolemia, unspecified: Secondary | ICD-10-CM

## 2023-10-07 ENCOUNTER — Ambulatory Visit (INDEPENDENT_AMBULATORY_CARE_PROVIDER_SITE_OTHER)
Admission: RE | Admit: 2023-10-07 | Discharge: 2023-10-07 | Disposition: A | Discharge status: Home / Self Care | Payer: Self-pay | Source: Ambulatory Visit | Attending: Endocrinology | Admitting: Endocrinology

## 2023-10-07 DIAGNOSIS — E7800 Pure hypercholesterolemia, unspecified: Secondary | ICD-10-CM

## 2023-11-12 ENCOUNTER — Other Ambulatory Visit: Payer: Self-pay | Admitting: Obstetrics and Gynecology

## 2023-11-12 ENCOUNTER — Ambulatory Visit
Admission: RE | Admit: 2023-11-12 | Discharge: 2023-11-12 | Disposition: A | Discharge status: Home / Self Care | Source: Ambulatory Visit | Attending: Obstetrics and Gynecology | Admitting: Obstetrics and Gynecology

## 2023-11-12 DIAGNOSIS — R928 Other abnormal and inconclusive findings on diagnostic imaging of breast: Secondary | ICD-10-CM

## 2023-11-12 DIAGNOSIS — N6489 Other specified disorders of breast: Secondary | ICD-10-CM

## 2024-04-27 ENCOUNTER — Encounter

## 2024-05-13 ENCOUNTER — Encounter
